# Patient Record
Sex: Female | Born: 1990 | Race: Black or African American | Hispanic: No | Marital: Single | Smoking: Former smoker
Health system: Southern US, Community
[De-identification: ages and names within clinical notes are randomized; demographics above are authoritative.]

## PROBLEM LIST (undated history)

## (undated) HISTORY — PX: INDUCED ABORTION: SHX677

---
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
## 2015-04-17 ENCOUNTER — Encounter (HOSPITAL_COMMUNITY): Payer: Self-pay | Admitting: Emergency Medicine

## 2015-04-17 ENCOUNTER — Emergency Department (HOSPITAL_COMMUNITY)
Admission: EM | Admit: 2015-04-17 | Discharge: 2015-04-17 | Disposition: A | Discharge status: Home / Self Care | Payer: BLUE CROSS/BLUE SHIELD | Attending: Emergency Medicine | Admitting: Emergency Medicine

## 2015-04-17 DIAGNOSIS — L02411 Cutaneous abscess of right axilla: Secondary | ICD-10-CM | POA: Insufficient documentation

## 2015-04-17 DIAGNOSIS — R350 Frequency of micturition: Secondary | ICD-10-CM | POA: Diagnosis present

## 2015-04-17 DIAGNOSIS — L02412 Cutaneous abscess of left axilla: Secondary | ICD-10-CM | POA: Insufficient documentation

## 2015-04-17 DIAGNOSIS — R300 Dysuria: Secondary | ICD-10-CM | POA: Insufficient documentation

## 2015-04-17 DIAGNOSIS — Z3202 Encounter for pregnancy test, result negative: Secondary | ICD-10-CM | POA: Diagnosis not present

## 2015-04-17 DIAGNOSIS — L0291 Cutaneous abscess, unspecified: Secondary | ICD-10-CM

## 2015-04-17 LAB — URINALYSIS, ROUTINE W REFLEX MICROSCOPIC
Bilirubin Urine: NEGATIVE
Glucose, UA: NEGATIVE mg/dL
Hgb urine dipstick: NEGATIVE
Ketones, ur: NEGATIVE mg/dL
Leukocytes, UA: NEGATIVE
Nitrite: NEGATIVE
Protein, ur: NEGATIVE mg/dL
Specific Gravity, Urine: 1.026 (ref 1.005–1.030)
Urobilinogen, UA: 0.2 mg/dL (ref 0.0–1.0)
pH: 6 (ref 5.0–8.0)

## 2015-04-17 LAB — POC URINE PREG, ED: Preg Test, Ur: NEGATIVE

## 2015-04-17 MED ORDER — CLINDAMYCIN HCL 300 MG PO CAPS: 300.0000 mg | ORAL_CAPSULE | Freq: Three times a day (TID) | ORAL | Status: DC

## 2015-04-17 NOTE — ED Provider Notes (Signed)
CSN: [id number]     Arrival date & time 04/17/15  0759 History   First MD Initiated Contact with Patient 04/17/15 [phone]     Chief Complaint  Patient presents with  . Abscess  . Urinary Frequency     (Consider location/radiation/quality/duration/timing/severity/associated sxs/prior Treatment) HPI Comments: Pt comes in with c/o urinary frequency and urgency that started a couple of days ago. No fever, abdominal pain, back pain, or vaginal discharge. Pt states that she also has swelling to the bilateral axilla. Has tried compresses without relief.  The history is provided by the patient. No language interpreter was used.    History reviewed. No pertinent past medical history. History reviewed. No pertinent past surgical history. History reviewed. No pertinent family history. History  Substance Use Topics  . Smoking status: Not on file  . Smokeless tobacco: Not on file  . Alcohol Use: Not on file   OB History    No data available     Review of Systems  All other systems reviewed and are negative.     Allergies  Review of patient's allergies indicates no known allergies.  Home Medications   Prior to Admission medications   Not on File   BP 111/68 mmHg  Pulse 96  Temp(Src) 98.6 F (37 C) (Oral)  Resp 20  SpO2 100%  LMP 03/19/2015 Physical Exam  Constitutional: She is oriented to person, place, and time. She appears well-developed and well-nourished.  HENT:  Head: Normocephalic and atraumatic.  Right Ear: External ear normal.  Left Ear: External ear normal.  Pulmonary/Chest: Effort normal and breath sounds normal.  Abdominal: Soft. Bowel sounds are normal. There is no tenderness.  Musculoskeletal: Normal range of motion.  Neurological: She is alert and oriented to person, place, and time.  Skin:  Small raised red areas to left axilla. Small bumps to right axilla  Nursing note and vitals reviewed.   ED Course  Procedures (including critical care time) Labs  Review Labs Reviewed  URINALYSIS, ROUTINE W REFLEX MICROSCOPIC - Abnormal; Notable for the following:    APPearance HAZY (*)    All other components within normal limits  POC URINE PREG, ED    Imaging Review No results found.   EKG Interpretation None      MDM   Final diagnoses:  Abscess  Dysuria    No infection noted in urine. Will treat abscess with clindamycin. No need for I&D at this time:pt five return precautions   Glendell Docker, NP 04/17/15 Ethel, MD 04/17/15 1625

## 2015-04-17 NOTE — ED Notes (Signed)
NP at bedside.

## 2015-04-17 NOTE — Discharge Instructions (Signed)

## 2015-04-17 NOTE — ED Notes (Signed)
Bilateral nondraining axillary abscesses. Been using warm compresses. Also, c/o urinary pressure. Thinks she has a UTI.

## 2015-04-17 NOTE — ED Notes (Signed)
PT ambulated with baseline gait; VSS; A&Ox3; no signs of distress; respirations even and unlabored; skin warm and dry; no questions upon discharge.  

## 2015-11-18 ENCOUNTER — Encounter (HOSPITAL_COMMUNITY): Payer: Self-pay | Admitting: Emergency Medicine

## 2015-11-18 ENCOUNTER — Emergency Department (HOSPITAL_COMMUNITY)
Admission: EM | Admit: 2015-11-18 | Discharge: 2015-11-18 | Disposition: A | Discharge status: Home / Self Care | Payer: Medicaid Other | Attending: Emergency Medicine | Admitting: Emergency Medicine

## 2015-11-18 DIAGNOSIS — L259 Unspecified contact dermatitis, unspecified cause: Secondary | ICD-10-CM | POA: Insufficient documentation

## 2015-11-18 DIAGNOSIS — Z792 Long term (current) use of antibiotics: Secondary | ICD-10-CM | POA: Insufficient documentation

## 2015-11-18 DIAGNOSIS — R21 Rash and other nonspecific skin eruption: Secondary | ICD-10-CM | POA: Diagnosis present

## 2015-11-18 MED ORDER — PREDNISONE 20 MG PO TABS: ORAL_TABLET | ORAL | Status: DC

## 2015-11-18 MED ORDER — TRIAMCINOLONE ACETONIDE 0.1 % EX LOTN: 1.0000 "application " | TOPICAL_LOTION | Freq: Three times a day (TID) | CUTANEOUS | Status: DC

## 2015-11-18 MED ORDER — HYDROXYZINE HCL 25 MG PO TABS: 25.0000 mg | ORAL_TABLET | Freq: Four times a day (QID) | ORAL | Status: DC

## 2015-11-18 NOTE — ED Notes (Signed)
Patient states rash x few days.   Patient states took 2 ibuprofen x 2 days ago.  Patient took benadryl yesterday.   Patient states rash on face, neck, chest, abdomen, back, arms.  Had some new soap, body scrub about 2 or 3 days ago.

## 2015-11-18 NOTE — ED Notes (Signed)
See PA assessment 

## 2015-11-18 NOTE — ED Provider Notes (Signed)
CSN: [id number]     Arrival date & time 11/18/15  0911 History  By signing my name below, I, Tracy Garza, attest that this documentation has been prepared under the direction and in the presence of Tracy Mail, PA-C. Electronically Signed: Stephania Garza, ED Scribe. 11/18/2015. 9:34 AM.    Chief Complaint  Patient presents with  . Rash   The history is provided by the patient. No language interpreter was used.    HPI Comments: Tracy Garza is a 24 y.o. female who presents to the Emergency Department complaining of a gradual-onset, constant, gradually spreading, generalized rash, particularly on her face and neck, abdomen, and back, that began 2 days ago. She reports she used a new soap - strawberry body scrub - about 2-3 days ago, before her rash onset. She also states she took 2 ibuprofen tablets before her rash onset but denies any previous allergic reactions to medications. She states she first noticed itching on her neck 2 days ago, and the sensation of itching then spread when she noticed the rash develop. Patient denies taking any regular medications. She states she took 1 benadryl yesterday, with mild relief to her symptoms. No one else in the house has a similar rash. Patient has NKDA. She denies wheezing, SOB, throat-closing sensation, or facial swelling.   No past medical history on file. No past surgical history on file. No family history on file. Social History  Substance Use Topics  . Smoking status: Not on file  . Smokeless tobacco: Not on file  . Alcohol Use: Not on file   OB History    No data available     Review of Systems  HENT: Negative for facial swelling and trouble swallowing.   Respiratory: Negative for shortness of breath and wheezing.   Skin: Positive for rash.   Allergies  Review of patient's allergies indicates no known allergies.  Home Medications   Prior to Admission medications   Medication Sig Start Date End Date Taking? Authorizing Provider   clindamycin (CLEOCIN) 300 MG capsule Take 1 capsule (300 mg total) by mouth 3 (three) times daily. 04/17/15   Glendell Docker, NP   There were no vitals taken for this visit. Physical Exam  Constitutional: She is oriented to person, place, and time. She appears well-developed and well-nourished. No distress.  HENT:  Head: Normocephalic and atraumatic.  Airway patent. Uvula midline. No swelling or edema.   Eyes: Conjunctivae and EOM are normal.  Neck: Neck supple. No tracheal deviation present.  No stridor.  Cardiovascular: Normal rate.   Pulmonary/Chest: Effort normal. No stridor. No respiratory distress.  Lungs are clear to auscultation. Breathing is normal.   Abdominal: Soft. There is no tenderness.  Musculoskeletal: Normal range of motion.  Neurological: She is alert and oriented to person, place, and time.  Skin: Skin is warm and dry. Rash noted. There is erythema.  Multiple areas of singular and confluent erythematous papules. Areas of excoriation across the chest, arms, abdomen, back, neck, and below the knees. No involvement of the thighs or genital region. Warm and erythematous in appearance. Is similar to an allergic reaction.   Psychiatric: She has a normal mood and affect. Her behavior is normal.  Nursing note and vitals reviewed.   ED Course  Procedures (including critical care time)  DIAGNOSTIC STUDIES: Oxygen Saturation is 99% on RA, normal by my interpretation.    COORDINATION OF CARE: 9:30 AM - Discussed treatment plan with pt at bedside. Pt verbalized understanding and agreed  to plan.   MDM   Final diagnoses:  None   Patient with contact dermatitis. Instructed to avoid offending agent and to use unscented soaps, lotions, and detergents. Will treat with prednisone, Atarax, and Kenalog lotion. Discussed possibility of allergy to ibuprofen  No signs of secondary infection. Follow up with PCP in 2-3 days. If symptoms worsen, she should return to the ED. Return  precautions discussed. Pt is safe for discharge at this time.   I personally performed the services described in this documentation, which was scribed in my presence. The recorded information has been reviewed and is accurate.          Tracy Mail, PA-C 11/18/15 [id number]  Virgel Manifold, MD 11/24/15 [phone]

## 2015-11-18 NOTE — Discharge Instructions (Signed)
Contact Dermatitis Dermatitis is redness, soreness, and swelling (inflammation) of the skin. Contact dermatitis is a reaction to certain substances that touch the skin. There are two types of contact dermatitis:   Irritant contact dermatitis. This type is caused by something that irritates your skin, such as dry hands from washing them too much. This type does not require previous exposure to the substance for a reaction to occur. This type is more common.  Allergic contact dermatitis. This type is caused by a substance that you are allergic to, such as a nickel allergy or poison ivy. This type only occurs if you have been exposed to the substance (allergen) before. Upon a repeat exposure, your body reacts to the substance. This type is less common. CAUSES  Many different substances can cause contact dermatitis. Irritant contact dermatitis is most commonly caused by exposure to:   Makeup.   Soaps.   Detergents.   Bleaches.   Acids.   Metal salts, such as nickel.  Allergic contact dermatitis is most commonly caused by exposure to:   Poisonous plants.   Chemicals.   Jewelry.   Latex.   Medicines.   Preservatives in products, such as clothing.  RISK FACTORS This condition is more likely to develop in:   People who have jobs that expose them to irritants or allergens.  People who have certain medical conditions, such as asthma or eczema.  SYMPTOMS  Symptoms of this condition may occur anywhere on your body where the irritant has touched you or is touched by you. Symptoms include:  Dryness or flaking.   Redness.   Cracks.   Itching.   Pain or a burning feeling.   Blisters.  Drainage of small amounts of blood or clear fluid from skin cracks. With allergic contact dermatitis, there may also be swelling in areas such as the eyelids, mouth, or genitals.  DIAGNOSIS  This condition is diagnosed with a medical history and physical exam. A patch skin test  may be performed to help determine the cause. If the condition is related to your job, you may need to see an occupational medicine specialist. TREATMENT Treatment for this condition includes figuring out what caused the reaction and protecting your skin from further contact. Treatment may also include:   Steroid creams or ointments. Oral steroid medicines may be needed in more severe cases.  Antibiotics or antibacterial ointments, if a skin infection is present.  Antihistamine lotion or an antihistamine taken by mouth to ease itching.  A bandage (dressing). HOME CARE INSTRUCTIONS Skin Care  Moisturize your skin as needed.   Apply cool compresses to the affected areas.  Try taking a bath with:  Epsom salts. Follow the instructions on the packaging. You can get these at your local pharmacy or grocery store.  Baking soda. Pour a small amount into the bath as directed by your health care provider.  Colloidal oatmeal. Follow the instructions on the packaging. You can get this at your local pharmacy or grocery store.  Try applying baking soda paste to your skin. Stir water into baking soda until it reaches a paste-like consistency.  Do not scratch your skin.  Bathe less frequently, such as every other day.  Bathe in lukewarm water. Avoid using hot water. Medicines  Take or apply over-the-counter and prescription medicines only as told by your health care provider.   If you were prescribed an antibiotic medicine, take or apply your antibiotic as told by your health care provider. Do not stop using the   antibiotic even if your condition starts to improve. General Instructions  Keep all follow-up visits as told by your health care provider. This is important.  Avoid the substance that caused your reaction. If you do not know what caused it, keep a journal to try to track what caused it. Write down:  What you eat.  What cosmetic products you use.  What you drink.  What  you wear in the affected area. This includes jewelry.  If you were given a dressing, take care of it as told by your health care provider. This includes when to change and remove it. SEEK MEDICAL CARE IF:   Your condition does not improve with treatment.  Your condition gets worse.  You have signs of infection such as swelling, tenderness, redness, soreness, or warmth in the affected area.  You have a fever.  You have new symptoms. SEEK IMMEDIATE MEDICAL CARE IF:   You have a severe headache, neck pain, or neck stiffness.  You vomit.  You feel very sleepy.  You notice red streaks coming from the affected area.  Your bone or joint underneath the affected area becomes painful after the skin has healed.  The affected area turns darker.  You have difficulty breathing.   This information is not intended to replace advice given to you by your health care provider. Make sure you discuss any questions you have with your health care provider.   Document Released: 12/02/2000 Document Revised: 08/26/2015 Document Reviewed: 04/22/2015 Elsevier Interactive Patient Education 2016 [employer]  

## 2015-12-12 ENCOUNTER — Emergency Department (HOSPITAL_COMMUNITY): Payer: Medicaid Other

## 2015-12-12 ENCOUNTER — Encounter (HOSPITAL_COMMUNITY): Payer: Self-pay | Admitting: *Deleted

## 2015-12-12 ENCOUNTER — Inpatient Hospital Stay (HOSPITAL_COMMUNITY)
Admission: EM | Admit: 2015-12-12 | Discharge: 2015-12-15 | DRG: 872 | Disposition: A | Discharge status: Home / Self Care | Payer: Medicaid Other | Attending: Internal Medicine | Admitting: Internal Medicine

## 2015-12-12 DIAGNOSIS — R000 Tachycardia, unspecified: Secondary | ICD-10-CM | POA: Diagnosis present

## 2015-12-12 DIAGNOSIS — N390 Urinary tract infection, site not specified: Secondary | ICD-10-CM | POA: Diagnosis not present

## 2015-12-12 DIAGNOSIS — A4151 Sepsis due to Escherichia coli [E. coli]: Secondary | ICD-10-CM | POA: Diagnosis not present

## 2015-12-12 DIAGNOSIS — R1032 Left lower quadrant pain: Secondary | ICD-10-CM | POA: Diagnosis not present

## 2015-12-12 DIAGNOSIS — A419 Sepsis, unspecified organism: Secondary | ICD-10-CM | POA: Diagnosis not present

## 2015-12-12 DIAGNOSIS — R059 Cough, unspecified: Secondary | ICD-10-CM | POA: Diagnosis present

## 2015-12-12 DIAGNOSIS — R05 Cough: Secondary | ICD-10-CM | POA: Diagnosis not present

## 2015-12-12 DIAGNOSIS — D72829 Elevated white blood cell count, unspecified: Secondary | ICD-10-CM | POA: Diagnosis present

## 2015-12-12 DIAGNOSIS — E871 Hypo-osmolality and hyponatremia: Secondary | ICD-10-CM | POA: Diagnosis present

## 2015-12-12 DIAGNOSIS — N12 Tubulo-interstitial nephritis, not specified as acute or chronic: Secondary | ICD-10-CM | POA: Diagnosis present

## 2015-12-12 DIAGNOSIS — B9620 Unspecified Escherichia coli [E. coli] as the cause of diseases classified elsewhere: Secondary | ICD-10-CM | POA: Diagnosis present

## 2015-12-12 DIAGNOSIS — E876 Hypokalemia: Secondary | ICD-10-CM | POA: Diagnosis present

## 2015-12-12 DIAGNOSIS — E861 Hypovolemia: Secondary | ICD-10-CM | POA: Diagnosis present

## 2015-12-12 DIAGNOSIS — F17210 Nicotine dependence, cigarettes, uncomplicated: Secondary | ICD-10-CM | POA: Diagnosis present

## 2015-12-12 DIAGNOSIS — R109 Unspecified abdominal pain: Secondary | ICD-10-CM | POA: Diagnosis present

## 2015-12-12 DIAGNOSIS — R6889 Other general symptoms and signs: Secondary | ICD-10-CM | POA: Diagnosis not present

## 2015-12-12 LAB — CBC WITH DIFFERENTIAL/PLATELET
Basophils Absolute: 0 10*3/uL (ref 0.0–0.1)
Basophils Absolute: 0 10*3/uL (ref 0.0–0.1)
Basophils Relative: 0 %
Basophils Relative: 0 %
EOS ABS: 0 10*3/uL (ref 0.0–0.7)
Eosinophils Absolute: 0 10*3/uL (ref 0.0–0.7)
Eosinophils Relative: 0 %
Eosinophils Relative: 0 %
HCT: 34.6 % — ABNORMAL LOW (ref 36.0–46.0)
HEMATOCRIT: 37.5 % (ref 36.0–46.0)
HEMOGLOBIN: 12.8 g/dL (ref 12.0–15.0)
HEMOGLOBIN: 11.5 g/dL — AB (ref 12.0–15.0)
LYMPHS ABS: 0.8 10*3/uL (ref 0.7–4.0)
LYMPHS ABS: 0.7 10*3/uL (ref 0.7–4.0)
LYMPHS PCT: 6 %
LYMPHS PCT: 5 %
MCH: 30.3 pg (ref 26.0–34.0)
MCH: 29.9 pg (ref 26.0–34.0)
MCHC: 34.1 g/dL (ref 30.0–36.0)
MCHC: 33.2 g/dL (ref 30.0–36.0)
MCV: 88.7 fL (ref 78.0–100.0)
MCV: 90.1 fL (ref 78.0–100.0)
MONO ABS: 0.5 10*3/uL (ref 0.1–1.0)
MONOS PCT: 4 %
Monocytes Absolute: 0.8 10*3/uL (ref 0.1–1.0)
Monocytes Relative: 5 %
NEUTROS ABS: 11.3 10*3/uL — AB (ref 1.7–7.7)
NEUTROS PCT: 89 %
Neutro Abs: 13 10*3/uL — ABNORMAL HIGH (ref 1.7–7.7)
Neutrophils Relative %: 91 %
Platelets: 125 10*3/uL — ABNORMAL LOW (ref 150–400)
Platelets: 107 10*3/uL — ABNORMAL LOW (ref 150–400)
RBC: 4.23 MIL/uL (ref 3.87–5.11)
RBC: 3.84 MIL/uL — ABNORMAL LOW (ref 3.87–5.11)
RDW: 12.5 % (ref 11.5–15.5)
RDW: 12.5 % (ref 11.5–15.5)
WBC: 14.6 10*3/uL — AB (ref 4.0–10.5)
WBC: 12.4 10*3/uL — AB (ref 4.0–10.5)

## 2015-12-12 LAB — BASIC METABOLIC PANEL
Anion gap: 9 (ref 5–15)
BUN: 8 mg/dL (ref 6–20)
CHLORIDE: 102 mmol/L (ref 101–111)
CO2: 22 mmol/L (ref 22–32)
Calcium: 8.8 mg/dL — ABNORMAL LOW (ref 8.9–10.3)
Creatinine, Ser: 0.76 mg/dL (ref 0.44–1.00)
GFR calc Af Amer: >60 mL/min (ref >60)
GFR calc non Af Amer: >60 mL/min (ref >60)
GLUCOSE: 94 mg/dL (ref 65–99)
POTASSIUM: 3.4 mmol/L — AB (ref 3.5–5.1)
Sodium: 133 mmol/L — ABNORMAL LOW (ref 135–145)

## 2015-12-12 LAB — WET PREP, GENITAL
Clue Cells Wet Prep HPF POC: NONE SEEN
Sperm: NONE SEEN
Trich, Wet Prep: NONE SEEN
Yeast Wet Prep HPF POC: NONE SEEN

## 2015-12-12 LAB — PREGNANCY, URINE: Preg Test, Ur: NEGATIVE

## 2015-12-12 LAB — URINALYSIS, ROUTINE W REFLEX MICROSCOPIC
GLUCOSE, UA: NEGATIVE mg/dL
Ketones, ur: 40 mg/dL — AB
Nitrite: POSITIVE — AB
Protein, ur: 30 mg/dL — AB
SPECIFIC GRAVITY, URINE: 1.017 (ref 1.005–1.030)
pH: 6 (ref 5.0–8.0)

## 2015-12-12 LAB — URINE MICROSCOPIC-ADD ON

## 2015-12-12 LAB — FIBRINOGEN: FIBRINOGEN: 487 mg/dL — AB (ref 204–475)

## 2015-12-12 LAB — MRSA PCR SCREENING: MRSA BY PCR: NEGATIVE

## 2015-12-12 LAB — COMPREHENSIVE METABOLIC PANEL
ALBUMIN: 2.7 g/dL — AB (ref 3.5–5.0)
ALT: 13 U/L — AB (ref 14–54)
AST: 19 U/L (ref 15–41)
Alkaline Phosphatase: 53 U/L (ref 38–126)
Anion gap: 10 (ref 5–15)
BILIRUBIN TOTAL: 1.1 mg/dL (ref 0.3–1.2)
BUN: 6 mg/dL (ref 6–20)
CHLORIDE: 105 mmol/L (ref 101–111)
CO2: 18 mmol/L — ABNORMAL LOW (ref 22–32)
CREATININE: 0.69 mg/dL (ref 0.44–1.00)
Calcium: 7.6 mg/dL — ABNORMAL LOW (ref 8.9–10.3)
GFR calc Af Amer: >60 mL/min (ref >60)
GLUCOSE: 107 mg/dL — AB (ref 65–99)
POTASSIUM: 3.3 mmol/L — AB (ref 3.5–5.1)
Sodium: 133 mmol/L — ABNORMAL LOW (ref 135–145)
Total Protein: 5.6 g/dL — ABNORMAL LOW (ref 6.5–8.1)

## 2015-12-12 LAB — INFLUENZA PANEL BY PCR (TYPE A & B)
H1N1 flu by pcr: NOT DETECTED
Influenza A By PCR: NEGATIVE
Influenza B By PCR: NEGATIVE

## 2015-12-12 LAB — I-STAT CG4 LACTIC ACID, ED
Lactic Acid, Venous: 1.81 mmol/L (ref 0.5–2.0)
Lactic Acid, Venous: 0.6 mmol/L (ref 0.5–2.0)

## 2015-12-12 LAB — APTT: APTT: 35 s (ref 24–37)

## 2015-12-12 LAB — CK: CK TOTAL: 44 U/L (ref 38–234)

## 2015-12-12 LAB — PROTIME-INR
INR: 1.28 (ref 0.00–1.49)
Prothrombin Time: 16.1 seconds — ABNORMAL HIGH (ref 11.6–15.2)

## 2015-12-12 LAB — PROCALCITONIN: PROCALCITONIN: 1.04 ng/mL

## 2015-12-12 MED ORDER — DEXTROSE 5 % IV SOLN
1.0000 g | INTRAVENOUS | Status: DC
  Administered 2015-12-13 – 2015-12-14 (×2): 1 g via INTRAVENOUS
  Filled 2015-12-12 (×3): qty 10

## 2015-12-12 MED ORDER — KETOROLAC TROMETHAMINE 30 MG/ML IJ SOLN
30.0000 mg | Freq: Three times a day (TID) | INTRAMUSCULAR | Status: DC | PRN
  Administered 2015-12-13: 30 mg via INTRAVENOUS
  Filled 2015-12-12 (×2): qty 1

## 2015-12-12 MED ORDER — SODIUM CHLORIDE 0.9 % IV BOLUS (SEPSIS)
1000.0000 mL | Freq: Once | INTRAVENOUS | Status: AC
  Administered 2015-12-12: 1000 mL via INTRAVENOUS

## 2015-12-12 MED ORDER — CYCLOBENZAPRINE HCL 10 MG PO TABS
5.0000 mg | ORAL_TABLET | Freq: Three times a day (TID) | ORAL | Status: DC | PRN
Start: 2015-12-12 — End: 2015-12-15
  Administered 2015-12-13: 5 mg via ORAL
  Filled 2015-12-12: qty 1

## 2015-12-12 MED ORDER — ACETAMINOPHEN 325 MG PO TABS
650.0000 mg | ORAL_TABLET | Freq: Four times a day (QID) | ORAL | Status: DC | PRN
  Administered 2015-12-12: 650 mg via ORAL
  Filled 2015-12-12: qty 2

## 2015-12-12 MED ORDER — ENOXAPARIN SODIUM 30 MG/0.3ML ~~LOC~~ SOLN
20.0000 mg | SUBCUTANEOUS | Status: DC
  Administered 2015-12-12 – 2015-12-14 (×3): 20 mg via SUBCUTANEOUS
  Filled 2015-12-12 (×2): qty 0.2
  Filled 2015-12-12 (×3): qty 0.3

## 2015-12-12 MED ORDER — DOXYCYCLINE HYCLATE 100 MG IV SOLR
100.0000 mg | Freq: Two times a day (BID) | INTRAVENOUS | Status: DC
  Administered 2015-12-12 – 2015-12-15 (×6): 100 mg via INTRAVENOUS
  Filled 2015-12-12 (×9): qty 100

## 2015-12-12 MED ORDER — ONDANSETRON HCL 4 MG/2ML IJ SOLN: 4.0000 mg | Freq: Four times a day (QID) | INTRAMUSCULAR | Status: DC | PRN

## 2015-12-12 MED ORDER — MORPHINE SULFATE (PF) 2 MG/ML IV SOLN
2.0000 mg | Freq: Once | INTRAVENOUS | Status: AC
  Administered 2015-12-12: 2 mg via INTRAVENOUS
  Filled 2015-12-12: qty 1

## 2015-12-12 MED ORDER — DEXTROSE 5 % IV SOLN
1.0000 g | Freq: Once | INTRAVENOUS | Status: AC
  Administered 2015-12-12: 1 g via INTRAVENOUS
  Filled 2015-12-12: qty 10

## 2015-12-12 MED ORDER — OXYCODONE-ACETAMINOPHEN 5-325 MG PO TABS
1.0000 | ORAL_TABLET | Freq: Once | ORAL | Status: AC
  Administered 2015-12-12: 1 via ORAL
  Filled 2015-12-12: qty 1

## 2015-12-12 MED ORDER — ACETAMINOPHEN 325 MG PO TABS
650.0000 mg | ORAL_TABLET | Freq: Four times a day (QID) | ORAL | Status: DC | PRN
  Administered 2015-12-13 (×2): 650 mg via ORAL
  Filled 2015-12-12 (×2): qty 2

## 2015-12-12 MED ORDER — IBUPROFEN 800 MG PO TABS
800.0000 mg | ORAL_TABLET | Freq: Once | ORAL | Status: AC
  Administered 2015-12-12: 800 mg via ORAL
  Filled 2015-12-12: qty 1

## 2015-12-12 MED ORDER — ONDANSETRON HCL 4 MG PO TABS
4.0000 mg | ORAL_TABLET | Freq: Four times a day (QID) | ORAL | Status: DC | PRN
  Administered 2015-12-15: 4 mg via ORAL
  Filled 2015-12-12: qty 1

## 2015-12-12 MED ORDER — DIPHENHYDRAMINE HCL 50 MG/ML IJ SOLN
25.0000 mg | Freq: Once | INTRAMUSCULAR | Status: AC
  Administered 2015-12-12: 25 mg via INTRAVENOUS
  Filled 2015-12-12: qty 1

## 2015-12-12 MED ORDER — ALPRAZOLAM 0.25 MG PO TABS
0.2500 mg | ORAL_TABLET | Freq: Three times a day (TID) | ORAL | Status: DC | PRN
  Administered 2015-12-13 – 2015-12-14 (×2): 0.25 mg via ORAL
  Filled 2015-12-12 (×3): qty 1

## 2015-12-12 MED ORDER — SODIUM CHLORIDE 0.9 % IJ SOLN
3.0000 mL | Freq: Two times a day (BID) | INTRAMUSCULAR | Status: DC
  Administered 2015-12-12 – 2015-12-13 (×3): 3 mL via INTRAVENOUS

## 2015-12-12 MED ORDER — ACETAMINOPHEN 650 MG RE SUPP: 650.0000 mg | Freq: Four times a day (QID) | RECTAL | Status: DC | PRN

## 2015-12-12 MED ORDER — SODIUM CHLORIDE 0.9 % IV SOLN
INTRAVENOUS | Status: DC
  Administered 2015-12-12 – 2015-12-14 (×2): via INTRAVENOUS

## 2015-12-12 NOTE — ED Notes (Signed)
Pt began having abdominal and back pain 3 days ago with urinary frequency with a bad odor. Pt denies any vaginal d/c or bleeding.

## 2015-12-12 NOTE — ED Provider Notes (Signed)
Complains of left flank pain radiating to lower abdomen for the past 3 days at [place] by nausea no vomiting and subjective fever. On exam patient is alert appears uncomfortable abdomen soft nontender she is markedly tender at left flank  Orlie Dakin, MD 12/12/15 1721

## 2015-12-12 NOTE — ED Notes (Signed)
Merry Proud PA notified of pt temp and HR, no new orders.

## 2015-12-12 NOTE — ED Provider Notes (Signed)
Hand-off from [employer], PA-C.  Pt is a 24 year old female presents today with abdominal pain, flank pain fever, urinary complaints.  Initial exam revealed tenderness in LLQ, no peritoneal signs, no CVA tenderness, mild tenderness in left lower back. Pelvic exam revealed small amount of vaginal d/c and no CMT.  WBC 14.6. UA revealed moderate hgb, positive nitrites and leuks, few bacteria. Negative pregnancy.  Initial evaluation she had a temperature of 100.5, respirations of 18 and a pulse of 110. Throughout her stay in the ED pt became more tachycardic and tachypneic (respirations at 28) while maintaining normal blood pressure. Pt given 2 L of normal saline in the ED, lactic acid resulted as normal. Patient is tolerating PO without difficulty but with increasing tachycardia and fever will plan to admit for IV antibiotics for further evaluation and management. Hospitalist (Dr. Posey Pronto) consulted regarding admission who recommended CT renal study to rule out kidney stones. After the results of the study he would be able to determine if this was a patient who will be admitted to [facility] or transferred [facility]. CT pending.  CT revealed no nephrolithiasis. Plan to admit patient to hospitalist service at [facility].       Chesley Noon [place], [place] 12/12/15 1858  Milton Ferguson, MD 12/14/15 [phone]

## 2015-12-12 NOTE — ED Provider Notes (Signed)
CSN: [id number]     Arrival date & time 12/12/15  1019 History   First MD Initiated Contact with Patient 12/12/15 1105     Chief Complaint  Patient presents with  . Back Pain  . Abdominal Pain     HPI   24 year old female presents today with abdominal pain, flank pain fever, urinary complaints. Patient reports symptoms started approximately 2 days ago with urinary frequency, discoloration, and pressure. She reports symptoms persisted until last night when she developed a fever and started feeling left lower back pain. She reports that she was using Motrin, developed nausea overnight no vomiting. History of the same approximately  5 years ago. Patient reports that she is sexually active, does not use protection, no history of STDs. She denies any vaginal discharge, bleeding. Reports her last normal menstrual cycle was 4 weeks ago.   History reviewed. No pertinent past medical history. History reviewed. No pertinent past surgical history. History reviewed. No pertinent family history. Social History  Substance Use Topics  . Smoking status: Current Every Day Smoker -- 0.50 packs/day    Types: Cigarettes  . Smokeless tobacco: None  . Alcohol Use: Yes     Comment: occasionally   OB History    No data available     Review of Systems  All other systems reviewed and are negative.   Allergies  Strawberry (diagnostic)  Home Medications   Prior to Admission medications   Medication Sig Start Date End Date Taking? Authorizing Provider  ibuprofen (ADVIL,MOTRIN) 200 MG tablet Take 400 mg by mouth every 6 (six) hours as needed (pain).   Yes Historical Provider, MD   BP 125/82 mmHg  Pulse 140  Temp(Src) 102.5 F (39.2 C) (Oral)  Resp 28  Ht 4' 9.5" (1.461 m)  Wt 43.999 kg  BMI 20.61 kg/m2  SpO2 98%  LMP 11/27/2015   Physical Exam  Constitutional: She is oriented to person, place, and time. She appears well-developed and well-nourished.  HENT:  Head: Normocephalic and  atraumatic.  Eyes: Conjunctivae are normal. Pupils are equal, round, and reactive to light. Right eye exhibits no discharge. Left eye exhibits no discharge. No scleral icterus.  Neck: Normal range of motion. No JVD present. No tracheal deviation present.  Pulmonary/Chest: Effort normal. No stridor.  Abdominal: Soft. Bowel sounds are normal. She exhibits no distension and no mass. There is tenderness. There is no rebound, no guarding and no CVA tenderness.    Genitourinary: Cervix exhibits no motion tenderness and no discharge. Right adnexum displays no mass, no tenderness and no fullness. Left adnexum displays no mass, no tenderness and no fullness. Vaginal discharge found.  Musculoskeletal:  Minor pain with palpation of left lower back and flank  Neurological: She is alert and oriented to person, place, and time. Coordination normal.  Psychiatric: She has a normal mood and affect. Her behavior is normal. Judgment and thought content normal.  Nursing note and vitals reviewed.   ED Course  Procedures (including critical care time) Labs Review Labs Reviewed  WET PREP, GENITAL - Abnormal; Notable for the following:    WBC, Wet Prep HPF POC MANY (*)    All other components within normal limits  CBC WITH DIFFERENTIAL/PLATELET - Abnormal; Notable for the following:    WBC 14.6 (*)    Platelets 125 (*)    Neutro Abs 13.0 (*)    All other components within normal limits  URINALYSIS, ROUTINE W REFLEX MICROSCOPIC (NOT AT [facility]) - Abnormal; Notable for the following:  Color, Urine AMBER (*)    APPearance CLOUDY (*)    Hgb urine dipstick MODERATE (*)    Bilirubin Urine SMALL (*)    Ketones, ur 40 (*)    Protein, ur 30 (*)    Nitrite POSITIVE (*)    Leukocytes, UA MODERATE (*)    All other components within normal limits  BASIC METABOLIC PANEL - Abnormal; Notable for the following:    Sodium 133 (*)    Potassium 3.4 (*)    Calcium 8.8 (*)    All other components within normal limits    URINE MICROSCOPIC-ADD ON - Abnormal; Notable for the following:    Squamous Epithelial / LPF 0-5 (*)    Bacteria, UA FEW (*)    All other components within normal limits  URINE CULTURE  PREGNANCY, URINE  RPR  HIV ANTIBODY (ROUTINE TESTING)  I-STAT CG4 LACTIC ACID, ED  GC/CHLAMYDIA PROBE AMP ([place]) NOT AT [facility]    Imaging Review No results found. I have personally reviewed and evaluated these images and lab results as part of my medical decision-making.   EKG Interpretation None      MDM   Final diagnoses:  Pyelonephritis  Abdominal pain    Labs: I-STAT lactic acid, RPR, HIV, wet prep, CBC, CMP, urinalysis, urine culture, pregnancy- moderate leukocytes nitrite positive almond of the CBC 14.6  Imaging: CT renal  Consults:  Therapeutics: Motrin, Benadryl, morphine, normal saline, ceftriaxone, Percocet  Discharge Meds:   Assessment/Plan: 24 year old female presents today with pyelonephritis. Upon initial evaluation she had a temperature of 100.5, respirations of 18 and a pulse of 110. Throughout her stay she became more tachycardic and at last evaluation and respirations at 28 maintaining normal blood pressure. She is febrile, tachycardic, with an elevated white count. She was given 2 L of normal saline here in the ED, lactic acid resulted as normal. Patient appeared in no acute distress, but continued to have elevated temperature with worsening tachycardia. Pressure remained stable improved over initial presentation. Patient is tolerating by mouth without difficulty but with increasing tachycardia and fever hospital admission for IV antibiotics for further evaluation and management. Spoke with hospitalist who recommended CT renal study to rule out kidney stones. Pelvic exam showed no cervical motion tenderness, although she did have a small amount of vaginal discharge. Low suspicion for PID. After the results of the study he would be able to determine if this was a patient  who deals [facility] or transferred [facility]. At the time of shift change patient's care was transferred on to oncoming provider pending CT results and hospital admission.  Patient care shared with Hoover Brunette MD who personally evaluated the patient.       Okey Regal, PA-C 12/13/15 [id number]  Orlie Dakin, MD 12/13/15 [phone]

## 2015-12-12 NOTE — Progress Notes (Addendum)
ANTIBIOTIC CONSULT NOTE - INITIAL  Pharmacy Consult for ceftriaxone Indication: UTI  Allergies  Allergen Reactions  . Morphine And Related Hives  . Strawberry (Diagnostic) Hives and Itching    Patient Measurements: Height: 4' 9.5" (146.1 cm) Weight: 97 lb (43.999 kg) IBW/kg (Calculated) : 39.75 Adjusted Body Weight:   Vital Signs: Temp: 102.5 F (39.2 C) (12/24 1441) Temp Source: Oral (12/24 1441) BP: 104/59 mmHg (12/24 1700) Pulse Rate: 134 (12/24 1700) Intake/Output from previous day:   Intake/Output from this shift:    Labs:  Recent Labs  12/12/15 1052  WBC 14.6*  HGB 12.8  PLT 125*  CREATININE 0.76   Estimated Creatinine Clearance: 68.1 mL/min (by C-G formula based on Cr of 0.76). No results for input(s): VANCOTROUGH, VANCOPEAK, VANCORANDOM, GENTTROUGH, GENTPEAK, GENTRANDOM, TOBRATROUGH, TOBRAPEAK, TOBRARND, AMIKACINPEAK, AMIKACINTROU, AMIKACIN in the last 72 hours.   Microbiology: Recent Results (from the past 720 hour(s))  Wet prep, genital     Status: Abnormal   Collection Time: 12/12/15 12:56 PM  Result Value Ref Range Status   Yeast Wet Prep HPF POC NONE SEEN NONE SEEN Final   Trich, Wet Prep NONE SEEN NONE SEEN Final   Clue Cells Wet Prep HPF POC NONE SEEN NONE SEEN Final   WBC, Wet Prep HPF POC MANY (A) NONE SEEN Final   Sperm NONE SEEN  Final    Medical History: History reviewed. No pertinent past medical history.  Assessment: 24 yo with abdominal pain and back pain for last 3 days. WBC 14.6, Pulse 140, Tmax 102.5.  Goal of Therapy:  Resolution of infection  Plan:  Ceftriaxone 1 gram Q24H Monitor CBC Follow up culture results  Melburn Popper 12/12/2015,5:08 PM

## 2015-12-12 NOTE — H&P (Addendum)
[facility] History and Physical  Patient: Tracy Garza  MRN: [id number]  DOB: 14-Nov-1991  DOS: the patient was seen and examined on 12/12/2015 PCP: No PCP Per Patient  Referring physician: Dr. Lenna Sciara Chief Complaint: Generalized body ache  HPI: Tracy Garza is a 24 y.o. female with Past medical history of UTI requiring admission at [facility] 4 years ago. Patient presents with complaints of generalized body ache ongoing for 3 days. She was significantly weak and was unable to walk and stand on her own due to severe pain as well. She also has some cough and fever and chills. She started having complaints of left-sided abdominal pain primarily in the flank. She denies having any blood in the urine or burning urination or increased urination. She started noticing some vaginal discharge today. She denies any high risk behavior. She had her period 3 weeks ago. She denies any recent urological gynecological procedure. She denies any drug or alcohol abuse. She does not take the medication at her baseline. She has been taking Tylenol as well as ibuprofen without any benefit.  The patient is coming from home.  At her baseline ambulates without support And is independent for most of her ADL; manages her medication on her own.  Review of Systems: as mentioned in the history of present illness.  A comprehensive review of the other systems is negative.  History reviewed. No pertinent past medical history. History reviewed. No pertinent past surgical history. Social History:  reports that she has been smoking Cigarettes.  She has been smoking about 0.50 packs per day. She does not have any smokeless tobacco history on file. She reports that she drinks alcohol. She reports that she does not use illicit drugs.  Allergies  Allergen Reactions  . Morphine And Related Hives  . Strawberry (Diagnostic) Hives and Itching    History reviewed. No pertinent family history.  Prior to  Admission medications   Medication Sig Start Date End Date Taking? Authorizing Provider  ibuprofen (ADVIL,MOTRIN) 200 MG tablet Take 400 mg by mouth every 6 (six) hours as needed (pain).   Yes Historical Provider, MD    Physical Exam: Filed Vitals:   12/12/15 1645 12/12/15 1700 12/12/15 1722 12/12/15 1730  BP: 106/61 104/59 98/59 105/71  Pulse: 142 134 134 123  Temp:   102.6 F (39.2 C)   TempSrc:      Resp: 25 35 31 21  Height:      Weight:      SpO2: 98% 97% 97% 98%    General: Alert, Awake and Oriented to Time, Place and Person. Appear in marked distress Eyes: PERRL ENT: Oral Mucosa clear moist. Neck: no JVD Cardiovascular: S1 and S2 Present, no Murmur, Peripheral Pulses Present Respiratory: Bilateral Air entry equal and Decreased,  Clear to Auscultation, no Crackles, no wheezes Abdomen: Bowel Sound present, Soft and left-sided tenderness Skin: no Rash Extremities: no Pedal edema, no calf tenderness Neurologic: Grossly no focal neuro deficit, other than lethargy  Labs on Admission:  CBC:  Recent Labs Lab 12/12/15 1052 12/12/15 1723  WBC 14.6* 12.4*  NEUTROABS 13.0* 11.3*  HGB 12.8 11.5*  HCT 37.5 34.6*  MCV 88.7 90.1  PLT 125* 107*    CMP     Component Value Date/Time   NA 133* 12/12/2015 1723   K 3.3* 12/12/2015 1723   CL 105 12/12/2015 1723   CO2 18* 12/12/2015 1723   GLUCOSE 107* 12/12/2015 1723   BUN 6 12/12/2015 1723   CREATININE  0.69 12/12/2015 1723   CALCIUM 7.6* 12/12/2015 1723   PROT 5.6* 12/12/2015 1723   ALBUMIN 2.7* 12/12/2015 1723   AST 19 12/12/2015 1723   ALT 13* 12/12/2015 1723   ALKPHOS 53 12/12/2015 1723   BILITOT 1.1 12/12/2015 1723   GFRNONAA >60 12/12/2015 1723   GFRAA >60 12/12/2015 1723     Recent Labs Lab 12/12/15 1723  CKTOTAL 44   BNP (last 3 results) No results for input(s): BNP in the last 8760 hours.  ProBNP (last 3 results) No results for input(s): PROBNP in the last 8760 hours.   Radiological Exams on  Admission: Dg Chest Port 1 View  12/12/2015  CLINICAL DATA:  Sepsis pt C/o dry cough, SOB, sternal CP, nausea x3-4 days. Current smoker. EXAM: PORTABLE CHEST - 1 VIEW COMPARISON:  04/07/2011 FINDINGS: Low lung volumes with crowding of bronchovascular structures at the lung bases. No confluent airspace disease or overt edema. Heart size upper limits normal for technique. No effusion.  No pneumothorax. Visualized skeletal structures are unremarkable. IMPRESSION: Low volumes.  No acute disease. Electronically Signed   By: Lucrezia Europe M.D.   On: 12/12/2015 17:33   EKG: Independently reviewed. sinus tachycardia.  Assessment/Plan 1. Sepsis secondary to UTI [facility])  The pt meets SIRS criteria Temperature >38.5C Tachycardia, Tachypnea Leucocytosis. With evidence of infection in urine, suspicious for pyelonephritis. Patient will be admitted in the hospital. We'll continue with ceftriaxone. Give her one liter of bolus as well. Gonococcal and chlamydia pending HIV RPR pending blood culture collected, sputum culture ordered, UDS pending, pro-calcitonin pending, urine pregnancy negative. Add doxycycline at present for PID. Check influenza panel PCR on droplet precaution Admitting stepdown unit. Check CT abdomen and pelvis to rule out any renal stone.  2. Hypokalemia. Replacing orally.  3. Flulike symptom. Checking influenza PCR.   Nutrition: Nothing by mouth pending CT scan results DVT Prophylaxis: subcutaneous Heparin  Advance goals of care discussion: Full code   Consults: None  Family Communication: No family was present at bedside Disposition: Admitted as inpatient, step-down unit.  Author: Berle Mull, MD [facility] Pager: [phone] 12/12/2015  If 7PM-7AM, please contact night-coverage [email or web address] Password [email or web address]

## 2015-12-12 NOTE — ED Notes (Signed)
Merry Proud PA aware of pt HR, no new orders.

## 2015-12-12 NOTE — ED Notes (Signed)
Pt R arm broke out in hives after morphine. Given benadryl for rash. No difficulty breathing or itching

## 2015-12-13 DIAGNOSIS — A419 Sepsis, unspecified organism: Secondary | ICD-10-CM

## 2015-12-13 DIAGNOSIS — N390 Urinary tract infection, site not specified: Secondary | ICD-10-CM

## 2015-12-13 LAB — CBC
HEMATOCRIT: 35.8 % — AB (ref 36.0–46.0)
HEMOGLOBIN: 12 g/dL (ref 12.0–15.0)
MCH: 30.5 pg (ref 26.0–34.0)
MCHC: 33.5 g/dL (ref 30.0–36.0)
MCV: 90.9 fL (ref 78.0–100.0)
Platelets: 114 10*3/uL — ABNORMAL LOW (ref 150–400)
RBC: 3.94 MIL/uL (ref 3.87–5.11)
RDW: 12.7 % (ref 11.5–15.5)
WBC: 14.6 10*3/uL — ABNORMAL HIGH (ref 4.0–10.5)

## 2015-12-13 LAB — COMPREHENSIVE METABOLIC PANEL
ALBUMIN: 2.4 g/dL — AB (ref 3.5–5.0)
ALT: 12 U/L — ABNORMAL LOW (ref 14–54)
ANION GAP: 8 (ref 5–15)
AST: 15 U/L (ref 15–41)
Alkaline Phosphatase: 53 U/L (ref 38–126)
BUN: 6 mg/dL (ref 6–20)
CO2: 24 mmol/L (ref 22–32)
Calcium: 7.9 mg/dL — ABNORMAL LOW (ref 8.9–10.3)
Chloride: 108 mmol/L (ref 101–111)
Creatinine, Ser: 0.6 mg/dL (ref 0.44–1.00)
GFR calc Af Amer: >60 mL/min (ref >60)
GFR calc non Af Amer: >60 mL/min (ref >60)
GLUCOSE: 98 mg/dL (ref 65–99)
POTASSIUM: 3.8 mmol/L (ref 3.5–5.1)
SODIUM: 140 mmol/L (ref 135–145)
Total Bilirubin: 0.4 mg/dL (ref 0.3–1.2)
Total Protein: 5.6 g/dL — ABNORMAL LOW (ref 6.5–8.1)

## 2015-12-13 LAB — LACTIC ACID, PLASMA: Lactic Acid, Venous: 1.3 mmol/L (ref 0.5–2.0)

## 2015-12-13 LAB — HIV ANTIBODY (ROUTINE TESTING W REFLEX): HIV Screen 4th Generation wRfx: NONREACTIVE

## 2015-12-13 LAB — RPR: RPR Ser Ql: NONREACTIVE

## 2015-12-13 MED ORDER — BISACODYL 10 MG RE SUPP: 10.0000 mg | Freq: Every day | RECTAL | Status: DC | PRN

## 2015-12-13 MED ORDER — FENTANYL CITRATE (PF) 100 MCG/2ML IJ SOLN
25.0000 ug | INTRAMUSCULAR | Status: DC | PRN
  Administered 2015-12-13 (×2): 25 ug via INTRAVENOUS
  Filled 2015-12-13 (×2): qty 2

## 2015-12-13 MED ORDER — SENNOSIDES-DOCUSATE SODIUM 8.6-50 MG PO TABS
1.0000 | ORAL_TABLET | Freq: Two times a day (BID) | ORAL | Status: DC
  Administered 2015-12-13 (×2): 1 via ORAL
  Filled 2015-12-13 (×3): qty 1

## 2015-12-13 MED ORDER — SODIUM CHLORIDE 0.9 % IV BOLUS (SEPSIS)
500.0000 mL | Freq: Once | INTRAVENOUS | Status: AC
  Administered 2015-12-13: 500 mL via INTRAVENOUS

## 2015-12-13 NOTE — Progress Notes (Signed)
[place] TEAM 1 - Stepdown/ICU TEAM PROGRESS NOTE  GLORIOUS SAFFER [id number] DOB: August 21, 1991 DOA: 12/12/2015 PCP: No PCP Per Patient  Admit HPI / Brief Narrative: 24 y.o. female with history of UTI requiring admission at [facility] 4 years ago who presented with complaints of generalized body aches for 3 days.  She became weak and was unable to walk and stand on her own.  This was accompanied by left-sided pain primarily in the flank.  HPI/Subjective: The pt continues to have fever to 103.  She denies cp, sob, n/v.  She continues to report L flank adn CVA area pain, but denies any pain in the R upper or lower abdom quadrant whatsoever.  She denies abdom distention/bloating.    Assessment/Plan:  Sepsis secondary to UTI   Temperature >38.5C, Tachycardia, Tachypnea, Leucocytosis with evidence of pyelonephritis - GC/chlamydia, HIV pending - RPR negative - pyelonephritis v/s PID - cont empiric abx - follow cx data   ?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 - no evidence to suggest bowel obstruction or colitis at present - follow clinically - advance to clear liquid diet and follow   Hypokalemia Replacing orally  Hyponatremia Due to hypovolemia - cont to volume resuscitate  Code Status: FULL Family Communication: no family present at time of exam Disposition Plan: SDU   Consultants: none  Procedures: none  Antibiotics: Rocephin 12/24 > Doxycycline 12/24 >  DVT prophylaxis: Lovenox  Objective: Blood pressure 115/68, pulse 122, temperature 103 F (39.4 C), temperature source Oral, resp. rate 27, height 4' 9.5" (1.461 m), weight 43.999 kg (97 lb), last menstrual period 11/27/2015, SpO2 98 %.  Intake/Output Summary (Last 24 hours) at 12/13/15 1239 Last data filed at 12/13/15 1202  Gross per 24 hour  Intake      0 ml  Output    225 ml  Net   -225 ml     Exam: General: No acute respiratory distress - alert and conversant  Lungs: Clear to auscultation bilaterally  without wheezes or crackles Cardiovascular: Regular rate and rhythm without murmur gallop or rub normal S1 and S2 Abdomen: modestly tender to deep palpation in L flank, nondistended, soft, bowel sounds positive, no rebound, no ascites, no appreciable mass Extremities: No significant cyanosis, clubbing, or edema bilateral lower extremities  Data Reviewed: Basic Metabolic Panel:  Recent Labs Lab 12/12/15 1052 12/12/15 1723 12/13/15 0258  NA 133* 133* 140  K 3.4* 3.3* 3.8  CL 102 105 108  CO2 22 18* 24  GLUCOSE 94 107* 98  BUN 8 6 6   CREATININE 0.76 0.69 0.60  CALCIUM 8.8* 7.6* 7.9*    CBC:  Recent Labs Lab 12/12/15 1052 12/12/15 1723 12/13/15 0258  WBC 14.6* 12.4* 14.6*  NEUTROABS 13.0* 11.3*  --   HGB 12.8 11.5* 12.0  HCT 37.5 34.6* 35.8*  MCV 88.7 90.1 90.9  PLT 125* 107* 114*    Liver Function Tests:  Recent Labs Lab 12/12/15 1723 12/13/15 0258  AST 19 15  ALT 13* 12*  ALKPHOS 53 53  BILITOT 1.1 0.4  PROT 5.6* 5.6*  ALBUMIN 2.7* 2.4*   Coags:  Recent Labs Lab 12/12/15 1723  INR 1.28    Recent Labs Lab 12/12/15 1723  APTT 35    Cardiac Enzymes:  Recent Labs Lab 12/12/15 1723  CKTOTAL 44    Recent Results (from the past 240 hour(s))  Wet prep, genital     Status: Abnormal   Collection Time: 12/12/15 12:56 PM  Result Value Ref Range  Status   Yeast Wet Prep HPF POC NONE SEEN NONE SEEN Final   Trich, Wet Prep NONE SEEN NONE SEEN Final   Clue Cells Wet Prep HPF POC NONE SEEN NONE SEEN Final   WBC, Wet Prep HPF POC MANY (A) NONE SEEN Final   Sperm NONE SEEN  Final  MRSA PCR Screening     Status: None   Collection Time: 12/12/15  7:56 PM  Result Value Ref Range Status   MRSA by PCR NEGATIVE NEGATIVE Final    Comment:        The GeneXpert MRSA Assay (FDA approved for NASAL specimens only), is one component of a comprehensive MRSA colonization surveillance program. It is not intended to diagnose MRSA infection nor to guide  or monitor treatment for MRSA infections.      Studies:   Recent x-ray studies have been reviewed in detail by the Attending Physician  Scheduled Meds:  Scheduled Meds: . cefTRIAXone (ROCEPHIN)  IV  1 g Intravenous Q24H  . doxycycline (VIBRAMYCIN) IV  100 mg Intravenous Q12H  . enoxaparin (LOVENOX) injection  20 mg Subcutaneous Q24H  . sodium chloride  3 mL Intravenous Q12H    Time spent on care of this patient: 35 mins   MCCLUNG,JEFFREY T , MD   [facility] Office  [phone] Pager - Text Page per Shea Evans as per below:  On-Call/Text Page:      [email or web address]      password [email or web address]  If 7PM-7AM, please contact night-coverage [email or web address] Password [email or web address] 12/13/2015, 12:39 PM   LOS: 1 day

## 2015-12-14 LAB — COMPREHENSIVE METABOLIC PANEL
ALBUMIN: 2.2 g/dL — AB (ref 3.5–5.0)
ALK PHOS: 50 U/L (ref 38–126)
ALT: 12 U/L — ABNORMAL LOW (ref 14–54)
AST: 13 U/L — AB (ref 15–41)
Anion gap: 7 (ref 5–15)
BILIRUBIN TOTAL: 0.3 mg/dL (ref 0.3–1.2)
CALCIUM: 7.7 mg/dL — AB (ref 8.9–10.3)
CO2: 23 mmol/L (ref 22–32)
CREATININE: 0.56 mg/dL (ref 0.44–1.00)
Chloride: 112 mmol/L — ABNORMAL HIGH (ref 101–111)
GFR calc Af Amer: >60 mL/min (ref >60)
GFR calc non Af Amer: >60 mL/min (ref >60)
GLUCOSE: 84 mg/dL (ref 65–99)
Potassium: 3.2 mmol/L — ABNORMAL LOW (ref 3.5–5.1)
Sodium: 142 mmol/L (ref 135–145)
TOTAL PROTEIN: 5 g/dL — AB (ref 6.5–8.1)

## 2015-12-14 LAB — CBC
HCT: 32 % — ABNORMAL LOW (ref 36.0–46.0)
Hemoglobin: 10.7 g/dL — ABNORMAL LOW (ref 12.0–15.0)
MCH: 29.7 pg (ref 26.0–34.0)
MCHC: 33.4 g/dL (ref 30.0–36.0)
MCV: 88.9 fL (ref 78.0–100.0)
PLATELETS: 119 10*3/uL — AB (ref 150–400)
RBC: 3.6 MIL/uL — ABNORMAL LOW (ref 3.87–5.11)
RDW: 12.6 % (ref 11.5–15.5)
WBC: 8.3 10*3/uL (ref 4.0–10.5)

## 2015-12-14 LAB — TSH: TSH: 1.228 u[IU]/mL (ref 0.350–4.500)

## 2015-12-14 MED ORDER — SENNOSIDES-DOCUSATE SODIUM 8.6-50 MG PO TABS
1.0000 | ORAL_TABLET | Freq: Every evening | ORAL | Status: DC | PRN
Start: 2015-12-14 — End: 2015-12-15

## 2015-12-14 MED ORDER — POTASSIUM CHLORIDE CRYS ER 20 MEQ PO TBCR
40.0000 meq | EXTENDED_RELEASE_TABLET | Freq: Once | ORAL | Status: AC
  Administered 2015-12-14: 40 meq via ORAL
  Filled 2015-12-14: qty 2

## 2015-12-14 NOTE — Progress Notes (Signed)
Utilization Review Completed.Tracy Garza T12/26/2016  

## 2015-12-14 NOTE — Progress Notes (Signed)
[place] TEAM 1 - Stepdown/ICU TEAM PROGRESS NOTE  Tracy Garza [id number] DOB: 25-Dec-1990 DOA: 12/12/2015 PCP: No PCP Per Patient  Admit HPI / Brief Narrative: 24 y.o. female with history of UTI requiring admission at [facility] 4 years ago who presented with complaints of generalized body aches for 3 days.  She became weak and was unable to walk and stand on her own.  This was accompanied by left-sided pain primarily in the flank.  HPI/Subjective: The pt is resting comfortably.  She denies cp, n/v, or any abdom pain pain. She is tolerating her diet w/o difficulty at this time, though she did have one episode of vomiting early today.    Assessment/Plan:  Sepsis secondary to pansensitive E coli UTI   Temperature >38.5C, Tachycardia, Tachypnea, Leucocytosis at admit with evidence of pyelonephritis - GC/chlamydia still pending (possible not done?) - HIV negative - RPR negative - clinically now appears most c/w pyelonephritis - cont directed abx via IV for today - if stable over night will transition to oral meds in AM and d/c home   Sinus tachycardia Despite hydration and stabilization o/w the pt remains tachycardic - TSH normal - lactate normal - on no meds at home - cont to follow on tele as overall trend is one of improvement   ?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 - no evidence to suggest bowel obstruction or colitis at present - follow clinically - tolerating diet w/o difficulty   Hypokalemia Cont to replace and follow trend   Hyponatremia Due to hypovolemia - resolved w/ volume expansion   Code Status: FULL Family Communication: no family present at time of exam Disposition Plan: probable d/c home in AM, therefore will not transfer pt tonight   Consultants: none  Procedures: none  Antibiotics: Rocephin 12/24 > Doxycycline 12/24 >  DVT prophylaxis: Lovenox  Objective: Blood pressure 107/71, pulse 108, temperature 97.3 F (36.3 C), temperature source  Oral, resp. rate 30, height 4' 9.5" (1.461 m), weight 45.6 kg (100 lb 8.5 oz), last menstrual period 11/27/2015, SpO2 100 %.  Intake/Output Summary (Last 24 hours) at 12/14/15 1514 Last data filed at 12/14/15 1300  Gross per 24 hour  Intake 7320.5 ml  Output   2300 ml  Net 5020.5 ml   Exam: General: No acute respiratory distress  Lungs: Clear to auscultation bilaterally  Cardiovascular: tachycardic but regular - no gallup rub or M Abdomen: no longer tender to palpation - bs+ - no rebound or mass - soft  Extremities: No significant cyanosis, clubbing, edema bilateral lower extremities  Data Reviewed: Basic Metabolic Panel:  Recent Labs Lab 12/12/15 1052 12/12/15 1723 12/13/15 0258 12/14/15 0304  NA 133* 133* 140 142  K 3.4* 3.3* 3.8 3.2*  CL 102 105 108 112*  CO2 22 18* 24 23  GLUCOSE 94 107* 98 84  BUN 8 6 6  <5*  CREATININE 0.76 0.69 0.60 0.56  CALCIUM 8.8* 7.6* 7.9* 7.7*    CBC:  Recent Labs Lab 12/12/15 1052 12/12/15 1723 12/13/15 0258 12/14/15 0304  WBC 14.6* 12.4* 14.6* 8.3  NEUTROABS 13.0* 11.3*  --   --   HGB 12.8 11.5* 12.0 10.7*  HCT 37.5 34.6* 35.8* 32.0*  MCV 88.7 90.1 90.9 88.9  PLT 125* 107* 114* 119*    Liver Function Tests:  Recent Labs Lab 12/12/15 1723 12/13/15 0258 12/14/15 0304  AST 19 15 13*  ALT 13* 12* 12*  ALKPHOS 53 53 50  BILITOT 1.1 0.4 0.3  PROT 5.6* 5.6*  5.0*  ALBUMIN 2.7* 2.4* 2.2*   Coags:  Recent Labs Lab 12/12/15 1723  INR 1.28    Recent Labs Lab 12/12/15 1723  APTT 35    Cardiac Enzymes:  Recent Labs Lab 12/12/15 1723  CKTOTAL 44    Recent Results (from the past 240 hour(s))  Urine culture     Status: None (Preliminary result)   Collection Time: 12/12/15 10:35 AM  Result Value Ref Range Status   Specimen Description URINE, CLEAN CATCH  Final   Special Requests Normal  Final   Culture >=100,000 COLONIES/mL ESCHERICHIA COLI  Final   Report Status PENDING  Incomplete   Organism ID, Bacteria  ESCHERICHIA COLI  Final      Susceptibility   Escherichia coli - MIC*    AMPICILLIN <=2 SENSITIVE Sensitive     CEFAZOLIN <=4 SENSITIVE Sensitive     CEFTRIAXONE <=1 SENSITIVE Sensitive     CIPROFLOXACIN <=0.25 SENSITIVE Sensitive     GENTAMICIN <=1 SENSITIVE Sensitive     IMIPENEM <=0.25 SENSITIVE Sensitive     NITROFURANTOIN <=16 SENSITIVE Sensitive     TRIMETH/SULFA <=20 SENSITIVE Sensitive     AMPICILLIN/SULBACTAM <=2 SENSITIVE Sensitive     PIP/TAZO <=4 SENSITIVE Sensitive     * >=100,000 COLONIES/mL ESCHERICHIA COLI  Wet prep, genital     Status: Abnormal   Collection Time: 12/12/15 12:56 PM  Result Value Ref Range Status   Yeast Wet Prep HPF POC NONE SEEN NONE SEEN Final   Trich, Wet Prep NONE SEEN NONE SEEN Final   Clue Cells Wet Prep HPF POC NONE SEEN NONE SEEN Final   WBC, Wet Prep HPF POC MANY (A) NONE SEEN Final   Sperm NONE SEEN  Final  Culture, blood (x 2)     Status: None (Preliminary result)   Collection Time: 12/12/15  5:15 PM  Result Value Ref Range Status   Specimen Description BLOOD LEFT ARM  Final   Special Requests BOTTLES DRAWN AEROBIC AND ANAEROBIC 5CC  Final   Culture NO GROWTH 2 DAYS  Final   Report Status PENDING  Incomplete  Culture, blood (x 2)     Status: None (Preliminary result)   Collection Time: 12/12/15  5:19 PM  Result Value Ref Range Status   Specimen Description BLOOD LEFT HAND  Final   Special Requests BOTTLES DRAWN AEROBIC AND ANAEROBIC 5CC  Final   Culture NO GROWTH 2 DAYS  Final   Report Status PENDING  Incomplete  MRSA PCR Screening     Status: None   Collection Time: 12/12/15  7:56 PM  Result Value Ref Range Status   MRSA by PCR NEGATIVE NEGATIVE Final    Comment:        The GeneXpert MRSA Assay (FDA approved for NASAL specimens only), is one component of a comprehensive MRSA colonization surveillance program. It is not intended to diagnose MRSA infection nor to guide or monitor treatment for MRSA infections.       Studies:   Recent x-ray studies have been reviewed in detail by the Attending Physician  Scheduled Meds:  Scheduled Meds: . cefTRIAXone (ROCEPHIN)  IV  1 g Intravenous Q24H  . doxycycline (VIBRAMYCIN) IV  100 mg Intravenous Q12H  . enoxaparin (LOVENOX) injection  20 mg Subcutaneous Q24H  . senna-docusate  1 tablet Oral BID  . sodium chloride  3 mL Intravenous Q12H    Time spent on care of this patient: 35 mins   Stina Gane T , MD   [facility]  Office  [phone] Pager - Text Page per Shea Evans as per below:  On-Call/Text Page:      [email or web address]      password [email or web address]  If 7PM-7AM, please contact night-coverage [email or web address] Password [email or web address] 12/14/2015, 3:14 PM   LOS: 2 days

## 2015-12-14 NOTE — Progress Notes (Signed)
Nutrition Brief Note  Patient identified on the Malnutrition Screening Tool (MST) Report  Wt Readings from Last 15 Encounters:  12/14/15 100 lb 8.5 oz (45.6 kg)   24 y.o. female with history of UTI requiring admission at [facility] 4 years ago who presented with complaints of generalized body aches for 3 days. She became weak and was unable to walk and stand on her own. This was accompanied by left-sided pain primarily in the flank.  Pt admitted with sepsis secondary to UTI.  Pt was unavailable at times of visits (x 3). Pt was either receiving nursing care or using bedside commode at times of visits. Noted pt had consumed about 50% of breakfast tray.  Body mass index is 21.36 kg/(m^2). Patient meets criteria for normal weight range based on current BMI.   Current diet order is regular, patient is consuming approximately 50% of meals at this time. Labs and medications reviewed.   No nutrition interventions warranted at this time. If nutrition issues arise, please consult RD.   Shareen Capwell A. Jimmye Lightsey, RD, LDN, CDE Pager: [phone] After hours Pager: [phone]

## 2015-12-15 LAB — CBC
HEMATOCRIT: 30.2 % — AB (ref 36.0–46.0)
HEMOGLOBIN: 10.6 g/dL — AB (ref 12.0–15.0)
MCH: 30.5 pg (ref 26.0–34.0)
MCHC: 35.1 g/dL (ref 30.0–36.0)
MCV: 87 fL (ref 78.0–100.0)
Platelets: 151 10*3/uL (ref 150–400)
RBC: 3.47 MIL/uL — ABNORMAL LOW (ref 3.87–5.11)
RDW: 12.5 % (ref 11.5–15.5)
WBC: 7.4 10*3/uL (ref 4.0–10.5)

## 2015-12-15 LAB — BASIC METABOLIC PANEL
ANION GAP: 7 (ref 5–15)
BUN: <5 mg/dL — ABNORMAL LOW (ref 6–20)
CHLORIDE: 110 mmol/L (ref 101–111)
CO2: 22 mmol/L (ref 22–32)
Calcium: 8.2 mg/dL — ABNORMAL LOW (ref 8.9–10.3)
Creatinine, Ser: 0.51 mg/dL (ref 0.44–1.00)
GFR calc Af Amer: >60 mL/min (ref >60)
GLUCOSE: 86 mg/dL (ref 65–99)
POTASSIUM: 3.6 mmol/L (ref 3.5–5.1)
Sodium: 139 mmol/L (ref 135–145)

## 2015-12-15 LAB — CORTISOL: Cortisol, Plasma: 3.3 ug/dL

## 2015-12-15 MED ORDER — AZITHROMYCIN 500 MG PO TABS
1000.0000 mg | ORAL_TABLET | Freq: Once | ORAL | Status: AC
  Administered 2015-12-15: 1000 mg via ORAL
  Filled 2015-12-15: qty 2

## 2015-12-15 MED ORDER — CEFUROXIME AXETIL 500 MG PO TABS: 500.0000 mg | ORAL_TABLET | Freq: Two times a day (BID) | ORAL | Status: DC

## 2015-12-15 MED ORDER — ONDANSETRON HCL 4 MG PO TABS: 4.0000 mg | ORAL_TABLET | Freq: Four times a day (QID) | ORAL | Status: DC | PRN

## 2015-12-15 MED ORDER — CEFUROXIME AXETIL 500 MG PO TABS
500.0000 mg | ORAL_TABLET | Freq: Two times a day (BID) | ORAL | Status: DC
  Administered 2015-12-15: 500 mg via ORAL
  Filled 2015-12-15 (×3): qty 1

## 2015-12-15 NOTE — Significant Event (Signed)
Discharge instruction reviewed with patient. A copy of AVS given to patient. Patient awaiting for transportation from family to arrive at 12pm. All questions answered. Patient has all her belongings at bedside to take home.

## 2015-12-15 NOTE — Discharge Summary (Signed)
DISCHARGE SUMMARY  Tracy Garza  MR#: [id number]  DOB:1990-12-27  Date of Admission: 12/12/2015 Date of Discharge: 12/15/2015  Attending 34 T  Patient's PCP:No PCP Per Patient  Consults:  none  Disposition: D/C home   Follow-up Appts:     Follow-up Information    Follow up with Appointed [facility] Primary Care Doctor In 1 week.   Why:  contact the office on your Medicaid card to arrange follow up with your assigned primary care provider      Tests Needing Follow-up: -no specific tests to follow up - resume usual maintenance and assure pt continues to improve from pyelonephritis   Discharge Diagnoses: Sepsis secondary to pansensitive E coli UTI  Sinus tachycardia ?mobile cecum Hypokalemia Hyponatremia  Initial presentation: 24 y.o. female with history of UTI requiring admission at [facility] 4 years ago who presented with complaints of generalized body aches for 3 days. She became weak and was unable to walk and stand on her own. This was accompanied by left-sided pain primarily in the flank.  Hospital Course:  Sepsis secondary to pansensitive E coli UTI  Temperature >38.5C, Tachycardia, Tachypnea, Leucocytosis at admit with evidence of pyelonephritis - GC/chlamydia ordered but never done, though sx not convincing for PID - pt dosed w/ 1g Azithro prior to d/c to assure chlamydia covered (was receiving doxy as inpt) - HIV negative - RPR negative - clinically most c/w pyelonephritis - transitioned to oral ceftin at time of d/c, to complete 10 full days of abx tx - all sepsis sx resolved at time of d/c - pt w/ no further flank pain, moving bowels, and tolerating a full diet w/o trouble   Sinus tachycardia TSH normal - lactate normal - on no meds at home - resolved w/ volume resuscitation    ?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 - no evidence to suggest bowel obstruction or colitis at present - tolerating diet w/o difficulty - having  regular bowel movements - pt advised of this possibility, and educated that she should present to the ED should she develop severe constipation or abdom pain  Hypokalemia Replaced to normal   Hyponatremia Due to hypovolemia - resolved w/ volume expansion     Medication List    TAKE these medications        cefUROXime 500 MG tablet  Commonly known as:  CEFTIN  Take 1 tablet (500 mg total) by mouth 2 (two) times daily with a meal.     ibuprofen 200 MG tablet  Commonly known as:  ADVIL,MOTRIN  Take 400 mg by mouth every 6 (six) hours as needed (pain).     ondansetron 4 MG tablet  Commonly known as:  ZOFRAN  Take 1 tablet (4 mg total) by mouth every 6 (six) hours as needed for nausea.       Day of Discharge BP 115/83 mmHg  Pulse 82  Temp(Src) 98.1 F (36.7 C) (Oral)  Resp 30  Ht 4' 9.5" (1.461 m)  Wt 49.578 kg (109 lb 4.8 oz)  BMI 23.23 kg/m2  SpO2 100%  LMP 11/27/2015  Physical Exam: General: No acute respiratory distress Lungs: Clear to auscultation bilaterally without wheezes or crackles Cardiovascular: Regular rate and rhythm without murmur gallop or rub normal S1 and S2 Abdomen: Nontender, nondistended, soft, bowel sounds positive, no rebound, no ascites, no appreciable mass Extremities: No significant cyanosis, clubbing, or edema bilateral lower extremities  Basic Metabolic Panel:  Recent Labs Lab 12/12/15 1052 12/12/15 1723 12/13/15 0258 12/14/15 0304 12/15/15 [id number]  NA 133* 133* 140 142 139  K 3.4* 3.3* 3.8 3.2* 3.6  CL 102 105 108 112* 110  CO2 22 18* 24 23 22   GLUCOSE 94 107* 98 84 86  BUN 8 6 6  <5* <5*  CREATININE 0.76 0.69 0.60 0.56 0.51  CALCIUM 8.8* 7.6* 7.9* 7.7* 8.2*    Liver Function Tests:  Recent Labs Lab 12/12/15 1723 12/13/15 0258 12/14/15 0304  AST 19 15 13*  ALT 13* 12* 12*  ALKPHOS 53 53 50  BILITOT 1.1 0.4 0.3  PROT 5.6* 5.6* 5.0*  ALBUMIN 2.7* 2.4* 2.2*    Coags:  Recent Labs Lab 12/12/15 1723  INR 1.28    CBC:  Recent Labs Lab 12/12/15 1052 12/12/15 1723 12/13/15 0258 12/14/15 0304 12/15/15 0203  WBC 14.6* 12.4* 14.6* 8.3 7.4  NEUTROABS 13.0* 11.3*  --   --   --   HGB 12.8 11.5* 12.0 10.7* 10.6*  HCT 37.5 34.6* 35.8* 32.0* 30.2*  MCV 88.7 90.1 90.9 88.9 87.0  PLT 125* 107* 114* 119* 151    Cardiac Enzymes:  Recent Labs Lab 12/12/15 1723  CKTOTAL 44    Recent Results (from the past 240 hour(s))  Urine culture     Status: None (Preliminary result)   Collection Time: 12/12/15 10:35 AM  Result Value Ref Range Status   Specimen Description URINE, CLEAN CATCH  Final   Special Requests Normal  Final   Culture >=100,000 COLONIES/mL ESCHERICHIA COLI  Final   Report Status PENDING  Incomplete   Organism ID, Bacteria ESCHERICHIA COLI  Final      Susceptibility   Escherichia coli - MIC*    AMPICILLIN <=2 SENSITIVE Sensitive     CEFAZOLIN <=4 SENSITIVE Sensitive     CEFTRIAXONE <=1 SENSITIVE Sensitive     CIPROFLOXACIN <=0.25 SENSITIVE Sensitive     GENTAMICIN <=1 SENSITIVE Sensitive     IMIPENEM <=0.25 SENSITIVE Sensitive     NITROFURANTOIN <=16 SENSITIVE Sensitive     TRIMETH/SULFA <=20 SENSITIVE Sensitive     AMPICILLIN/SULBACTAM <=2 SENSITIVE Sensitive     PIP/TAZO <=4 SENSITIVE Sensitive     * >=100,000 COLONIES/mL ESCHERICHIA COLI  Wet prep, genital     Status: Abnormal   Collection Time: 12/12/15 12:56 PM  Result Value Ref Range Status   Yeast Wet Prep HPF POC NONE SEEN NONE SEEN Final   Trich, Wet Prep NONE SEEN NONE SEEN Final   Clue Cells Wet Prep HPF POC NONE SEEN NONE SEEN Final   WBC, Wet Prep HPF POC MANY (A) NONE SEEN Final   Sperm NONE SEEN  Final  Culture, blood (x 2)     Status: None (Preliminary result)   Collection Time: 12/12/15  5:15 PM  Result Value Ref Range Status   Specimen Description BLOOD LEFT ARM  Final   Special Requests BOTTLES DRAWN AEROBIC AND ANAEROBIC 5CC  Final   Culture NO GROWTH 3 DAYS  Final   Report Status PENDING  Incomplete   Culture, blood (x 2)     Status: None (Preliminary result)   Collection Time: 12/12/15  5:19 PM  Result Value Ref Range Status   Specimen Description BLOOD LEFT HAND  Final   Special Requests BOTTLES DRAWN AEROBIC AND ANAEROBIC 5CC  Final   Culture NO GROWTH 3 DAYS  Final   Report Status PENDING  Incomplete  MRSA PCR Screening     Status: None   Collection Time: 12/12/15  7:56 PM  Result Value Ref Range Status  MRSA by PCR NEGATIVE NEGATIVE Final    Comment:        The GeneXpert MRSA Assay (FDA approved for NASAL specimens only), is one component of a comprehensive MRSA colonization surveillance program. It is not intended to diagnose MRSA infection nor to guide or monitor treatment for MRSA infections.     Time spent in discharge (includes decision making & examination of pt): >35 minutes  12/15/2015, 9:45 AM   Cherene Altes, MD [facility] Office  [phone] Pager [phone]  On-Call/Text Page:      [email or web address]      password [facility]

## 2015-12-15 NOTE — Progress Notes (Signed)
md req appt w [facility]. They are not giving any post hosp appts and no appts avail til sometime in march and will not give appt. Spoke w pt who has medicaid. She has seen md name on her card that she has been assigned to for pcp but was not awate that that was to be her pcp. 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 She and medicaid are only ones that can change her pcp.

## 2015-12-15 NOTE — Discharge Instructions (Signed)

## 2015-12-16 LAB — URINE CULTURE: Special Requests: NORMAL

## 2015-12-17 LAB — CULTURE, BLOOD (ROUTINE X 2)
CULTURE: NO GROWTH
Culture: NO GROWTH

## 2015-12-18 LAB — GC/CHLAMYDIA PROBE AMP (~~LOC~~) NOT AT ARMC
Chlamydia: NEGATIVE
Neisseria Gonorrhea: NEGATIVE

## 2015-12-20 DIAGNOSIS — L932 Other local lupus erythematosus: Secondary | ICD-10-CM

## 2015-12-20 HISTORY — DX: Other local lupus erythematosus: L93.2

## 2016-11-03 ENCOUNTER — Other Ambulatory Visit: Payer: Self-pay

## 2018-01-25 ENCOUNTER — Other Ambulatory Visit: Payer: Self-pay

## 2018-01-25 ENCOUNTER — Emergency Department (HOSPITAL_COMMUNITY)
Admission: EM | Admit: 2018-01-25 | Discharge: 2018-01-26 | Disposition: A | Discharge status: Home / Self Care | Payer: BLUE CROSS/BLUE SHIELD | Attending: Emergency Medicine | Admitting: Emergency Medicine

## 2018-01-25 ENCOUNTER — Encounter (HOSPITAL_COMMUNITY): Payer: Self-pay | Admitting: Emergency Medicine

## 2018-01-25 DIAGNOSIS — J101 Influenza due to other identified influenza virus with other respiratory manifestations: Secondary | ICD-10-CM | POA: Diagnosis not present

## 2018-01-25 DIAGNOSIS — F17210 Nicotine dependence, cigarettes, uncomplicated: Secondary | ICD-10-CM | POA: Diagnosis not present

## 2018-01-25 DIAGNOSIS — R6889 Other general symptoms and signs: Secondary | ICD-10-CM

## 2018-01-25 DIAGNOSIS — R509 Fever, unspecified: Secondary | ICD-10-CM | POA: Diagnosis present

## 2018-01-25 LAB — I-STAT BETA HCG BLOOD, ED (MC, WL, AP ONLY)

## 2018-01-25 MED ORDER — ACETAMINOPHEN 325 MG PO TABS
650.0000 mg | ORAL_TABLET | Freq: Once | ORAL | Status: AC | PRN
  Administered 2018-01-25: 650 mg via ORAL
  Filled 2018-01-25: qty 2

## 2018-01-25 MED ORDER — SODIUM CHLORIDE 0.9 % IV BOLUS (SEPSIS): 500.0000 mL | Freq: Once | INTRAVENOUS | Status: DC

## 2018-01-25 MED ORDER — SODIUM CHLORIDE 0.9 % IV BOLUS (SEPSIS)
1000.0000 mL | Freq: Once | INTRAVENOUS | Status: AC
  Administered 2018-01-25: 1000 mL via INTRAVENOUS

## 2018-01-25 MED ORDER — KETOROLAC TROMETHAMINE 30 MG/ML IJ SOLN
30.0000 mg | Freq: Once | INTRAMUSCULAR | Status: AC
  Administered 2018-01-26: 30 mg via INTRAVENOUS
  Filled 2018-01-25: qty 1

## 2018-01-25 NOTE — ED Triage Notes (Signed)
Pt reports generalized body aches with fever starting this morning.  Pt reports taking sudafed with some relief of HA. Pt tearful in triage, noted to be febrile.

## 2018-01-25 NOTE — ED Notes (Signed)
Pt st's she started to feel bad yesterday when started having body aches with elevated temp this am

## 2018-01-26 LAB — INFLUENZA PANEL BY PCR (TYPE A & B)
Influenza A By PCR: POSITIVE — AB
Influenza B By PCR: NEGATIVE

## 2018-01-26 MED ORDER — BENZONATATE 100 MG PO CAPS: 100.0000 mg | ORAL_CAPSULE | Freq: Three times a day (TID) | ORAL | 0 refills | Status: DC

## 2018-01-26 NOTE — ED Notes (Signed)
Pt tolerates water well.

## 2018-01-26 NOTE — ED Notes (Signed)
Pt ambulated in hall to restroom and back. No assistance. Pt is steady and feels strong on feet. No dizzy feelings.

## 2018-01-26 NOTE — ED Provider Notes (Signed)
[facility] EMERGENCY DEPARTMENT Provider Note   CSN: [id number] Arrival date & time: 01/25/18  1708     History   Chief Complaint Chief Complaint  Patient presents with  . Fever  . Generalized Body Aches    HPI Tracy Garza is a 27 y.o. female who presents for evaluation of generalized body aches, nasal congestion, rhinorrhea that began this morning.  Patient reports that she was at work when she started feeling symptoms.  Patient states that she went home and states that while at home, she developed subjective fever/chills, generalized body aches, cough, and worsening nasal congestion.  Patient reports that she has not taken any medication for the pain.  Patient reports that she felt slightly nauseous but has not had any vomiting.  She is been able to tolerate p.o. without any difficulty.  Patient states that she did not get her flu shot this year.  She denies any sick contacts.  Patient denies any chest pain, difficulty breathing, abdominal pain, vomiting.  The history is provided by the patient.    History reviewed. No pertinent past medical history.  Patient Active Problem List   Diagnosis Date Noted  . Pyelonephritis 12/12/2015    History reviewed. No pertinent surgical history.  OB History    No data available       Home Medications    Prior to Admission medications   Medication Sig Start Date End Date Taking? Authorizing Provider  benzonatate (TESSALON) 100 MG capsule Take 1 capsule (100 mg total) by mouth every 8 (eight) hours. 01/26/18   Volanda Napoleon, PA-C  cefUROXime (CEFTIN) 500 MG tablet Take 1 tablet (500 mg total) by mouth 2 (two) times daily with a meal. 12/15/15   Cherene Altes, MD  ibuprofen (ADVIL,MOTRIN) 200 MG tablet Take 400 mg by mouth every 6 (six) hours as needed (pain).    [provider]  ondansetron (ZOFRAN) 4 MG tablet Take 1 tablet (4 mg total) by mouth every 6 (six) hours as needed for nausea. 12/15/15    Cherene Altes, MD    Family History No family history on file.  Social History Social History   Tobacco Use  . Smoking status: Current Every Day Smoker    Packs/day: 0.25    Types: Cigarettes  . Smokeless tobacco: Never Used  Substance Use Topics  . Alcohol use: Yes    Comment: occasionally  . Drug use: No     Allergies   Morphine and related and Strawberry (diagnostic)   Review of Systems Review of Systems  Constitutional: Positive for chills and fever.  HENT: Positive for congestion and rhinorrhea.   Respiratory: Positive for cough. Negative for shortness of breath.   Cardiovascular: Negative for chest pain.  Gastrointestinal: Negative for abdominal pain, diarrhea, nausea and vomiting.  Genitourinary: Negative for dysuria and hematuria.  Musculoskeletal: Positive for myalgias. Negative for back pain and neck pain.  Skin: Negative for rash.  Neurological: Negative for dizziness, weakness, numbness and headaches.  All other systems reviewed and are negative.    Physical Exam Updated Vital Signs BP 106/65 (BP Location: Right Arm)   Pulse 80   Temp 98.2 F (36.8 C)   Resp 14   Ht 4\' 11"  (1.499 m)   Wt 50.8 kg (112 lb)   LMP 01/15/2018 (Exact Date)   SpO2 100%   BMI 22.62 kg/m   Physical Exam  Constitutional: She is oriented to person, place, and time. She appears well-developed and  well-nourished.  HENT:  Head: Normocephalic and atraumatic.  Nose: Mucosal edema and rhinorrhea present.  Mouth/Throat: Oropharynx is clear and moist and mucous membranes are normal.  Eyes: Conjunctivae, EOM and lids are normal. Pupils are equal, round, and reactive to light.  Neck: Full passive range of motion without pain. Neck supple. No neck rigidity.  Cardiovascular: Regular rhythm, normal heart sounds and normal pulses. Tachycardia present. Exam reveals no gallop and no friction rub.  No murmur heard. Pulmonary/Chest: Effort normal and breath sounds normal.  No  evidence of respiratory distress. Able to speak in full sentences without difficulty.  Abdominal: Soft. Normal appearance. There is no tenderness. There is no rigidity and no guarding.  Abdomen is soft, nondistended, nontender.  Musculoskeletal: Normal range of motion.  Neurological: She is alert and oriented to person, place, and time.  Skin: Skin is warm and dry. Capillary refill takes less than 2 seconds.  Psychiatric: She has a normal mood and affect. Her speech is normal.  Nursing note and vitals reviewed.    ED Treatments / Results  Labs (all labs ordered are listed, but only abnormal results are displayed) Labs Reviewed  INFLUENZA PANEL BY PCR (TYPE A & B) - Abnormal; Notable for the following components:      Result Value   Influenza A By PCR POSITIVE (*)    All other components within normal limits  I-STAT BETA HCG BLOOD, ED ([facility], [facility], [facility] ONLY)    EKG  EKG Interpretation None       Radiology No results found.  Procedures Procedures (including critical care time)  Medications Ordered in ED Medications  acetaminophen (TYLENOL) tablet 650 mg (650 mg Oral Given 01/25/18 1908)  sodium chloride 0.9 % bolus 1,000 mL (0 mLs Intravenous Stopped 01/26/18 0032)  ketorolac (TORADOL) 30 MG/ML injection 30 mg (30 mg Intravenous Given 01/26/18 0031)     Initial Impression / Assessment and Plan / ED Course  I have reviewed the triage vital signs and the nursing notes.  Pertinent labs & imaging results that were available during my care of the patient were reviewed by me and considered in my medical decision making (see chart for details).     27 year old female who presents for evaluation of fever, generalized body aches, nasal congestion, rhinorrhea that began today.  Patient reports that she did not get her flu shot.  Denies any sick contacts.  On initial ED arrival, patient is febrile, tachycardic.  On my exam, patient is still tachycardic.  No evidence of respiratory distress.   Concern for influenza given history/physical exam.  Repeat vitals show that patient is hypotensive.  Will plan to give fluid bolus here in the department.  Antipyretics given for fever.  Plan to send flu swab.  Repeat vitals are improved after liter bolus of fluid.  Plan to ambulate patient in the department.  Patient is tolerating p.o. in the department without any difficulty.  I-STAT beta is negative.  Patient ambulated without any symptoms.  She reports some chest soreness after coughing.  Repeat abdominal exam shows abdomen is soft, nondistended, nontender.  Lungs clear to auscultation.  Given concern for influenza, will plan to treat for flu.  Influenza panel is pending.  Given that patient is within the 24-hour.  I discussed with her regarding starting Tamiflu.  We discussed at length regarding risk first benefits of starting Tamiflu.  Patient does not wish to start Tamiflu at this time.  Encouraged home supportive care for further relief. Patient had  ample opportunity for questions and discussion. All patient's questions were answered with full understanding. Strict return precautions discussed. Patient expresses understanding and agreement to plan.   Final Clinical Impressions(s) / ED Diagnoses   Final diagnoses:  Flu-like symptoms    ED Discharge Orders        Ordered    benzonatate (TESSALON) 100 MG capsule  Every 8 hours     01/26/18 0045       Volanda Napoleon, PA-C 01/26/18 0059    Fatima Blank, MD 01/27/18 [phone]

## 2018-01-26 NOTE — Discharge Instructions (Signed)
You can take Tylenol or Ibuprofen as directed for pain. You can alternate Tylenol and Ibuprofen every 4 hours. If you take Tylenol at 1pm, then you can take Ibuprofen at 5pm. Then you can take Tylenol again at 9pm.   Make sure you are drinking plenty of fluids and staying hydrated.  Follow-up with your primary care doctor in the next 24-48 hours for further evaluation.  Return the emergency department for any worsening fever, chest pain, difficulty breathing, inability to eat or drink any food or any other worsening or concerning symptoms.

## 2020-01-23 ENCOUNTER — Inpatient Hospital Stay (HOSPITAL_COMMUNITY): Payer: BC Managed Care – PPO

## 2020-01-23 ENCOUNTER — Other Ambulatory Visit: Payer: Self-pay

## 2020-01-23 ENCOUNTER — Encounter (HOSPITAL_COMMUNITY): Payer: Self-pay | Admitting: Family Medicine

## 2020-01-23 ENCOUNTER — Inpatient Hospital Stay (HOSPITAL_COMMUNITY)
Admission: AD | Admit: 2020-01-23 | Discharge: 2020-01-23 | Disposition: A | Discharge status: Home / Self Care | Payer: BC Managed Care – PPO | Attending: Family Medicine | Admitting: Family Medicine

## 2020-01-23 DIAGNOSIS — Z87891 Personal history of nicotine dependence: Secondary | ICD-10-CM | POA: Diagnosis not present

## 2020-01-23 DIAGNOSIS — E876 Hypokalemia: Secondary | ICD-10-CM | POA: Diagnosis not present

## 2020-01-23 DIAGNOSIS — O3680X0 Pregnancy with inconclusive fetal viability, not applicable or unspecified: Secondary | ICD-10-CM

## 2020-01-23 DIAGNOSIS — Z3A01 Less than 8 weeks gestation of pregnancy: Secondary | ICD-10-CM | POA: Insufficient documentation

## 2020-01-23 DIAGNOSIS — O99281 Endocrine, nutritional and metabolic diseases complicating pregnancy, first trimester: Secondary | ICD-10-CM | POA: Diagnosis not present

## 2020-01-23 DIAGNOSIS — Z6790 Unspecified blood type, Rh positive: Secondary | ICD-10-CM

## 2020-01-23 DIAGNOSIS — O4690 Antepartum hemorrhage, unspecified, unspecified trimester: Secondary | ICD-10-CM

## 2020-01-23 DIAGNOSIS — Z885 Allergy status to narcotic agent status: Secondary | ICD-10-CM | POA: Insufficient documentation

## 2020-01-23 DIAGNOSIS — O209 Hemorrhage in early pregnancy, unspecified: Secondary | ICD-10-CM | POA: Diagnosis not present

## 2020-01-23 DIAGNOSIS — D251 Intramural leiomyoma of uterus: Secondary | ICD-10-CM | POA: Diagnosis not present

## 2020-01-23 DIAGNOSIS — O3411 Maternal care for benign tumor of corpus uteri, first trimester: Secondary | ICD-10-CM | POA: Insufficient documentation

## 2020-01-23 LAB — CBC
HCT: 41.1 % (ref 36.0–46.0)
Hemoglobin: 14.2 g/dL (ref 12.0–15.0)
MCH: 31.1 pg (ref 26.0–34.0)
MCHC: 34.5 g/dL (ref 30.0–36.0)
MCV: 89.9 fL (ref 80.0–100.0)
Platelets: 152 10*3/uL (ref 150–400)
RBC: 4.57 MIL/uL (ref 3.87–5.11)
RDW: 11.7 % (ref 11.5–15.5)
WBC: 5.1 10*3/uL (ref 4.0–10.5)
nRBC: 0 % (ref 0.0–0.2)

## 2020-01-23 LAB — WET PREP, GENITAL
Sperm: NONE SEEN
Trich, Wet Prep: NONE SEEN
Yeast Wet Prep HPF POC: NONE SEEN

## 2020-01-23 LAB — ABO/RH: ABO/RH(D): B POS

## 2020-01-23 LAB — URINALYSIS, ROUTINE W REFLEX MICROSCOPIC
Bilirubin Urine: NEGATIVE
Glucose, UA: NEGATIVE mg/dL
Ketones, ur: NEGATIVE mg/dL
Leukocytes,Ua: NEGATIVE
Nitrite: NEGATIVE
Protein, ur: NEGATIVE mg/dL
Specific Gravity, Urine: 1.026 (ref 1.005–1.030)
pH: 6 (ref 5.0–8.0)

## 2020-01-23 LAB — COMPREHENSIVE METABOLIC PANEL
ALT: 17 U/L (ref 0–44)
AST: 21 U/L (ref 15–41)
Albumin: 3.4 g/dL — ABNORMAL LOW (ref 3.5–5.0)
Alkaline Phosphatase: 37 U/L — ABNORMAL LOW (ref 38–126)
Anion gap: 2 — ABNORMAL LOW (ref 5–15)
BUN: 9 mg/dL (ref 6–20)
CO2: 24 mmol/L (ref 22–32)
Calcium: 9 mg/dL (ref 8.9–10.3)
Chloride: 107 mmol/L (ref 98–111)
Creatinine, Ser: 0.66 mg/dL (ref 0.44–1.00)
GFR calc Af Amer: >60 mL/min (ref >60)
GFR calc non Af Amer: >60 mL/min (ref >60)
Glucose, Bld: 122 mg/dL — ABNORMAL HIGH (ref 70–99)
Potassium: 3.3 mmol/L — ABNORMAL LOW (ref 3.5–5.1)
Sodium: 133 mmol/L — ABNORMAL LOW (ref 135–145)
Total Bilirubin: 0.5 mg/dL (ref 0.3–1.2)
Total Protein: 6.2 g/dL — ABNORMAL LOW (ref 6.5–8.1)

## 2020-01-23 LAB — POCT PREGNANCY, URINE: Preg Test, Ur: POSITIVE — AB

## 2020-01-23 LAB — HCG, QUANTITATIVE, PREGNANCY: hCG, Beta Chain, Quant, S: 2570 m[IU]/mL — ABNORMAL HIGH (ref <5)

## 2020-01-23 MED ORDER — POTASSIUM CHLORIDE ER 10 MEQ PO TBCR: 10.0000 meq | EXTENDED_RELEASE_TABLET | Freq: Every day | ORAL | 0 refills | Status: DC

## 2020-01-23 MED ORDER — ACETAMINOPHEN 500 MG PO TABS
1000.0000 mg | ORAL_TABLET | Freq: Once | ORAL | Status: AC
  Administered 2020-01-23: 1000 mg via ORAL
  Filled 2020-01-23: qty 2

## 2020-01-23 NOTE — ED Notes (Signed)
Joy, PA to triage to assess patient and need to go to MAU. Patient cleared by ED PA and accepted to MAU by their APP. Report given to MAU charge. Transport called to take patient over.

## 2020-01-23 NOTE — MAU Provider Note (Signed)
History     CSN: [id number]  Arrival date and time: 01/23/20 1730   First Provider Initiated Contact with Patient 01/23/20 2027      Chief Complaint  Patient presents with  . Vaginal Bleeding   [name] is a 29 y.o. [id number] at [redacted]w[redacted]d who presents to MAU for vaginal bleeding which began yesterday. Pt describes bleeding as spotting when wiping yesterday. Pt reports bleeding was link pink yesterday, but today is bright red. Pt also reports "really bad headache" that she has not attempted treatment for. Pt reports that she has not had anything to eat all day Pt denies history of headache issues. Pt rates HA as 8/10.  Passing blood clots? no Blood soaking clothes? no Lightheaded/dizzy? no Significant pelvic pain or cramping? no, pt describes sensation as pressure across the whole pelvis and inside umbilicus Passed any tissue? no Hx ectopic pregnancy? no Hx of PID, GYN surgery? no Hx of recurrent pregnancy loss/associated condition? no  Current pregnancy problems? pt has not yet been seen Blood Type? B Positive Allergies? Morphine, Strawberries Current medications? multi-vitamins Current PNC & next appt? none  Pt denies vaginal discharge/odor/itching. Pt denies N/V, abdominal pain, constipation, diarrhea, or urinary problems. Pt denies fever, chills, fatigue, sweating or changes in appetite. Pt denies SOB or chest pain. Pt denies dizziness, light-headedness, weakness.   OB History    Gravida  7   Para  2   Term  2   Preterm      AB  4   Living  2     SAB      TAB  4   Ectopic      Multiple      Live Births  2           Past Medical History:  Diagnosis Date  . Cutaneous lupus erythematosus 2017    History reviewed. No pertinent surgical history.  Family History  Problem Relation Age of Onset  . Fibromyalgia Mother   . Multiple sclerosis Sister   . Multiple sclerosis Maternal Uncle   . Cancer Maternal Grandmother   . Lupus Paternal  Grandmother     Social History   Tobacco Use  . Smoking status: Former Smoker    Packs/day: 0.25    Types: Cigarettes  . Smokeless tobacco: Never Used  Substance Use Topics  . Alcohol use: Not Currently    Comment: occasionally  . Drug use: No    Allergies:  Allergies  Allergen Reactions  . Morphine And Related Hives  . Strawberry (Diagnostic) Hives and Itching    Medications Prior to Admission  Medication Sig Dispense Refill Last Dose  . benzonatate (TESSALON) 100 MG capsule Take 1 capsule (100 mg total) by mouth every 8 (eight) hours. 21 capsule 0   . cefUROXime (CEFTIN) 500 MG tablet Take 1 tablet (500 mg total) by mouth 2 (two) times daily with a meal. 14 tablet 0   . ibuprofen (ADVIL,MOTRIN) 200 MG tablet Take 400 mg by mouth every 6 (six) hours as needed (pain).     . ondansetron (ZOFRAN) 4 MG tablet Take 1 tablet (4 mg total) by mouth every 6 (six) hours as needed for nausea. 10 tablet 0     Review of Systems  Constitutional: Negative for chills, diaphoresis, fatigue and fever.  Eyes: Negative for visual disturbance.  Respiratory: Negative for shortness of breath.   Cardiovascular: Negative for chest pain.  Gastrointestinal: Negative for abdominal pain, constipation, diarrhea, nausea and vomiting.  Genitourinary: Positive for pelvic pain (pressure) and vaginal bleeding. Negative for dysuria, flank pain, frequency, urgency and vaginal discharge.  Neurological: Positive for headaches. Negative for dizziness, weakness and light-headedness.   Physical Exam   Blood pressure 116/76, pulse 76, temperature 98.8 F (37.1 C), temperature source Oral, resp. rate 12, last menstrual period 12/14/2019, SpO2 99 %.  Patient Vitals for the past 24 hrs:  BP Temp Temp src Pulse Resp SpO2  01/23/20 1834 116/76 -- -- 76 -- --  01/23/20 1832 -- 98.8 F (37.1 C) Oral -- 12 99 %  01/23/20 1749 108/67 98.7 F (37.1 C) -- 93 16 100 %  01/23/20 1736 108/67 98.7 F (37.1 C) Oral 93 16  100 %   Physical Exam  Constitutional: She is oriented to person, place, and time. She appears well-developed and well-nourished. No distress.  HENT:  Head: Normocephalic and atraumatic.  Respiratory: Effort normal.  GI: Soft. She exhibits no distension and no mass. There is no abdominal tenderness. There is no rebound and no guarding.  Genitourinary: There is no rash, tenderness or lesion on the right labia. There is no rash, tenderness or lesion on the left labia. Uterus is not enlarged and not tender. Cervix exhibits no motion tenderness, no discharge and no friability. Right adnexum displays no mass, no tenderness and no fullness. Left adnexum displays no mass, no tenderness and no fullness.    Vaginal bleeding (minimal blood present coating vaginal walls, minimal bleeding from external cervical os, dark red, no clots) present.     No vaginal discharge or tenderness.  There is bleeding (minimal blood present coating vaginal walls, minimal bleeding from external cervical os, dark red, no clots) in the vagina. No tenderness in the vagina.    Genitourinary Comments: Cervix closed on bimanual exam.   Neurological: She is alert and oriented to person, place, and time.  Skin: Skin is warm and dry. She is not diaphoretic.  Psychiatric: She has a normal mood and affect. Her behavior is normal. Judgment and thought content normal.   Results for orders placed or performed during the hospital encounter of 01/23/20 (from the past 24 hour(s))  Urinalysis, Routine w reflex microscopic     Status: Abnormal   Collection Time: 01/23/20  6:21 PM  Result Value Ref Range   Color, Urine YELLOW YELLOW   APPearance HAZY (A) CLEAR   Specific Gravity, Urine 1.026 1.005 - 1.030   pH 6.0 5.0 - 8.0   Glucose, UA NEGATIVE NEGATIVE mg/dL   Hgb urine dipstick LARGE (A) NEGATIVE   Bilirubin Urine NEGATIVE NEGATIVE   Ketones, ur NEGATIVE NEGATIVE mg/dL   Protein, ur NEGATIVE NEGATIVE mg/dL   Nitrite NEGATIVE  NEGATIVE   Leukocytes,Ua NEGATIVE NEGATIVE   RBC / HPF 0-5 0 - 5 RBC/hpf   WBC, UA 0-5 0 - 5 WBC/hpf   Bacteria, UA RARE (A) NONE SEEN   Squamous Epithelial / LPF 6-10 0 - 5   Mucus PRESENT   Pregnancy, urine POC     Status: Abnormal   Collection Time: 01/23/20  6:23 PM  Result Value Ref Range   Preg Test, Ur POSITIVE (A) NEGATIVE  CBC     Status: None   Collection Time: 01/23/20  7:09 PM  Result Value Ref Range   WBC 5.1 4.0 - 10.5 K/uL   RBC 4.57 3.87 - 5.11 MIL/uL   Hemoglobin 14.2 12.0 - 15.0 g/dL   HCT 41.1 36.0 - 46.0 %   MCV 89.9 80.0 -  100.0 fL   MCH 31.1 26.0 - 34.0 pg   MCHC 34.5 30.0 - 36.0 g/dL   RDW 11.7 11.5 - 15.5 %   Platelets 152 150 - 400 K/uL   nRBC 0.0 0.0 - 0.2 %  Comprehensive metabolic panel     Status: Abnormal   Collection Time: 01/23/20  7:09 PM  Result Value Ref Range   Sodium 133 (L) 135 - 145 mmol/L   Potassium 3.3 (L) 3.5 - 5.1 mmol/L   Chloride 107 98 - 111 mmol/L   CO2 24 22 - 32 mmol/L   Glucose, Bld 122 (H) 70 - 99 mg/dL   BUN 9 6 - 20 mg/dL   Creatinine, Ser 0.66 0.44 - 1.00 mg/dL   Calcium 9.0 8.9 - 10.3 mg/dL   Total Protein 6.2 (L) 6.5 - 8.1 g/dL   Albumin 3.4 (L) 3.5 - 5.0 g/dL   AST 21 15 - 41 U/L   ALT 17 0 - 44 U/L   Alkaline Phosphatase 37 (L) 38 - 126 U/L   Total Bilirubin 0.5 0.3 - 1.2 mg/dL   GFR calc non Af Amer >60 >60 mL/min   GFR calc Af Amer >60 >60 mL/min   Anion gap 2 (L) 5 - 15  hCG, quantitative, pregnancy     Status: Abnormal   Collection Time: 01/23/20  7:09 PM  Result Value Ref Range   hCG, Beta Chain, Quant, S 2,570 (H) <5 mIU/mL  ABO/Rh     Status: None   Collection Time: 01/23/20  7:09 PM  Result Value Ref Range   ABO/RH(D) B POS    No rh immune globuloin      NOT A RH IMMUNE GLOBULIN CANDIDATE, PT RH POSITIVE Performed at [facility], [address], [place], [place] [address]   Wet prep, genital     Status: Abnormal   Collection Time: 01/23/20  7:11 PM   Specimen: PATH Cytology  Cervicovaginal Ancillary Only  Result Value Ref Range   Yeast Wet Prep HPF POC NONE SEEN NONE SEEN   Trich, Wet Prep NONE SEEN NONE SEEN   Clue Cells Wet Prep HPF POC PRESENT (A) NONE SEEN   WBC, Wet Prep HPF POC FEW (A) NONE SEEN   Sperm NONE SEEN    US OB LESS THAN 14 WEEKS WITH OB TRANSVAGINAL  Result Date: 01/23/2020 CLINICAL DATA:  Pregnant patient with vaginal bleeding. EXAM: OBSTETRIC <14 WK [place] AND TRANSVAGINAL OB US TECHNIQUE: Both transabdominal and transvaginal ultrasound examinations were performed for complete evaluation of the gestation as well as the maternal uterus, adnexal regions, and pelvic cul-de-sac. Transvaginal technique was performed to assess early pregnancy. COMPARISON:  None. FINDINGS: Intrauterine gestational sac: Probable Yolk sac:  Not Visualized. Embryo:  Not Visualized. Cardiac Activity: Not Visualized. MSD: 3.5 mm   5 w   0 d Subchorionic hemorrhage:  None visualized. Maternal uterus/adnexae: Normal right and left ovaries. There is a 1.6 x 1.5 x 1.6 cm intramural fibroid along the posterior uterine body. IMPRESSION: Probable early intrauterine gestational sac, but no yolk sac, fetal pole, or cardiac activity yet visualized. Recommend follow-up quantitative B-HCG levels and follow-up US in 14 days to assess viability. This recommendation follows SRU consensus guidelines: Diagnostic Criteria for Nonviable Pregnancy Early in the First Trimester. [place] [place] Med [id number]. Electronically Signed   By: Lovey Newcomer M.D.   On: 01/23/2020 19:46   MAU Course  Procedures  MDM -r/o ectopic -minimal bleeding without abdominal or  pelvic pain on exam -UA: hazy/lg hgb/rare bacteria, sending urine for culture -CBC: WNL -CMP: Na 133, K 3.3, Anion Gap <2 -[place]: probable early intrauterine gestational sac, but no yolk sac, fetal pole, or cardiac activity yet visualized. -hCG: 2,570 -ABO: B Positive -WetPrep: ClueCells (isolated finding not requiring treatment) -GC/CT  collected -HA 8/10 on admission, Tylenol 1000mg  + food/drink given, pt reports HA now 0/10 -consulted with Dr. Kennon Rounds, [place] for patient to be discharged home with f/u hCG in 48hrs -pt discharged to home in stable condition  Orders Placed This Encounter  Procedures  . Wet prep, genital    Standing Status:   Standing    Number of Occurrences:   1  . Culture, OB Urine    Standing Status:   Standing    Number of Occurrences:   1  . US OB LESS THAN 14 WEEKS WITH OB TRANSVAGINAL    Standing Status:   Standing    Number of Occurrences:   1    Order Specific Question:   Symptom/Reason for Exam    Answer:   Vaginal bleeding in pregnancy [id number]  . Urinalysis, Routine w reflex microscopic    Standing Status:   Standing    Number of Occurrences:   1  . CBC    Standing Status:   Standing    Number of Occurrences:   1  . Comprehensive metabolic panel    Standing Status:   Standing    Number of Occurrences:   1  . hCG, quantitative, pregnancy    Standing Status:   Standing    Number of Occurrences:   1  . Pregnancy, urine POC    Standing Status:   Standing    Number of Occurrences:   1  . ABO/Rh    Standing Status:   Standing    Number of Occurrences:   1  . Discharge patient    Order Specific Question:   Discharge disposition    Answer:   01-Home or Self Care [1]    Order Specific Question:   Discharge patient date    Answer:   01/23/2020   Meds ordered this encounter  Medications  . acetaminophen (TYLENOL) tablet 1,000 mg  . potassium chloride (KLOR-CON) 10 MEQ tablet    Sig: Take 1 tablet (10 mEq total) by mouth daily for 5 days.    Dispense:  5 tablet    Refill:  0    Order Specific Question:   Supervising Provider    Answer:   CONSTANT, PEGGY [[id number]]   Assessment and Plan   1. Pregnancy of unknown anatomic location   2. Vaginal bleeding in pregnancy   3. Blood type, Rh positive   4. Hypokalemia    Allergies as of 01/23/2020      Reactions   Morphine And Related Hives    Strawberry (diagnostic) Hives, Itching      Medication List    STOP taking these medications   ibuprofen 200 MG tablet Commonly known as: ADVIL   ondansetron 4 MG tablet Commonly known as: ZOFRAN     TAKE these medications   benzonatate 100 MG capsule Commonly known as: TESSALON Take 1 capsule (100 mg total) by mouth every 8 (eight) hours.   cefUROXime 500 MG tablet Commonly known as: CEFTIN Take 1 tablet (500 mg total) by mouth 2 (two) times daily with a meal.   potassium chloride 10 MEQ tablet Commonly known as: KLOR-CON Take 1 tablet (10 mEq total) by mouth  daily for 5 days.      -will call with culture results, if positive -RX KDur 12mEq x5days -return to MAU on Saturday 01/25/2020 around 7pm or early Sunday AM for repeat hCG -safe meds in pregnancy list given -strict ectopic/bleeding/pain/return MAU precautions given -pt discharged to home in stable condition  Elmyra Ricks E Meria Crilly 01/23/2020, 10:02 PM

## 2020-01-23 NOTE — ED Provider Notes (Signed)
MSE was initiated and I personally evaluated the patient and placed orders (if any) at  5:57 PM on January 23, 2020.  Was asked to see this pregnant patient prior to her being sent to MAU.  She complains of lower abdominal cramping for the last 2 days.  Spotting vaginal bleeding, using no more than 2 panty liners in a 24-hour period.  Accompanied by nausea and a feeling of overall weakness.  Denies cough, shortness of breath, fever, vomiting, diarrhea, chest pain.  Physical Exam Vitals and nursing note reviewed.  Constitutional:      General: She is not in acute distress.    Appearance: She is well-developed. She is not diaphoretic.  HENT:     Head: Normocephalic and atraumatic.     Mouth/Throat:     Mouth: Mucous membranes are moist.  Eyes:     Conjunctiva/sclera: Conjunctivae normal.  Cardiovascular:     Rate and Rhythm: Normal rate and regular rhythm.     Pulses: Normal pulses.  Pulmonary:     Effort: Pulmonary effort is normal. No respiratory distress.  Abdominal:     Palpations: Abdomen is soft.     Tenderness: There is no abdominal tenderness. There is no guarding.  Musculoskeletal:     Cervical back: Neck supple.  Skin:    General: Skin is warm and dry.  Neurological:     Mental Status: She is alert.  Psychiatric:        Mood and Affect: Mood and affect normal.        Speech: Speech normal.        Behavior: Behavior normal.    5:53 PM Spoke with Lattie Haw, Midwife at [facility]. Discussed patient and her complaints. Agrees to accept patient at MAU.   Vitals:   01/23/20 1736 01/23/20 1749  BP: 108/67 108/67  Pulse: 93 93  Resp: 16 16  Temp: 98.7 F (37.1 C) 98.7 F (37.1 C)  TempSrc: Oral   SpO2: 100% 100%      The patient appears stable so that the remainder of the MSE may be completed by another provider.   Lorayne Bender, PA-C 01/23/20 1801    Hayden Rasmussen, MD 01/24/20 1019

## 2020-01-23 NOTE — Discharge Instructions (Signed)
Uterine Fibroids  Uterine fibroids (leiomyomas) are noncancerous (benign) tumors that can develop in the uterus. Fibroids may also develop in the fallopian tubes, cervix, or tissues (ligaments) near the uterus. You may have one or many fibroids. Fibroids vary in size, weight, and where they grow in the uterus. Some can become quite large. Most fibroids do not require medical treatment. What are the causes? The cause of this condition is not known. What increases the risk? You are more likely to develop this condition if you:  Are in your 30s or 40s and have not gone through menopause.  Have a family history of this condition.  Are of African-American descent.  Had your first period at an early age (early menarche).  Have not had any children (nulliparity).  Are overweight or obese. What are the signs or symptoms? Many women do not have any symptoms. Symptoms of this condition may include:  Heavy menstrual bleeding.  Bleeding or spotting between periods.  Pain and pressure in the pelvic area, between the hips.  Bladder problems, such as needing to urinate urgently or more often than usual.  Inability to have children (infertility).  Failure to carry pregnancy to term (miscarriage). How is this diagnosed? This condition may be diagnosed based on:  Your symptoms and medical history.  A physical exam.  A pelvic exam that includes feeling for any tumors.  Imaging tests, such as ultrasound or MRI. How is this treated? Treatment for this condition may include:  Seeing your health care provider for follow-up visits to monitor your fibroids for any changes.  Taking NSAIDs such as ibuprofen, naproxen, or aspirin to reduce pain.  Hormone medicines. These may be taken as a pill, given in an injection, or delivered by a T-shaped device that is inserted into the uterus (intrauterine device, IUD).  Surgery to remove one of the following: ? The fibroids (myomectomy). Your health  care provider may recommend this if fibroids affect your fertility and you want to become pregnant. ? The uterus (hysterectomy). ? Blood supply to the fibroids (uterine artery embolization). Follow these instructions at home:  Take over-the-counter and prescription medicines only as told by your health care provider.  Ask your health care provider if you should take iron pills or eat more iron-rich foods, such as dark green, leafy vegetables. Heavy menstrual bleeding can cause low iron levels.  If directed, apply heat to your back or abdomen to reduce pain. Use the heat source that your health care provider recommends, such as a moist heat pack or a heating pad. ? Place a towel between your skin and the heat source. ? Leave the heat on for 20-30 minutes. ? Remove the heat if your skin turns bright red. This is especially important if you are unable to feel pain, heat, or cold. You may have a greater risk of getting burned.  Pay close attention to your menstrual cycle. Tell your health care provider about any changes, such as: ? Increased blood flow that requires you to use more pads or tampons than usual. ? A change in the number of days that your period lasts. ? A change in symptoms that are associated with your period, such as back pain or cramps in your abdomen.  Keep all follow-up visits as told by your health care provider. This is important, especially if your fibroids need to be monitored for any changes. Contact a health care provider if you:  Have pelvic pain, back pain, or cramps in your abdomen that   do not get better with medicine or heat.  Develop new bleeding between periods.  Have increased bleeding during or between periods.  Feel unusually tired or weak.  Feel light-headed. Get help right away if you:  Faint.  Have pelvic pain that suddenly gets worse.  Have severe vaginal bleeding that soaks a tampon or pad in 30 minutes or less. Summary  Uterine fibroids are  noncancerous (benign) tumors that can develop in the uterus.  The exact cause of this condition is not known.  Most fibroids do not require medical treatment unless they affect your ability to have children (fertility).  Contact a health care provider if you have pelvic pain, back pain, or cramps in your abdomen that do not get better with medicines.  Make sure you know what symptoms should cause you to get help right away. This information is not intended to replace advice given to you by your health care provider. Make sure you discuss any questions you have with your health care provider. Document Revised: 11/17/2017 Document Reviewed: 10/31/2017 Elsevier Patient Education  [place]. Ectopic Pregnancy  An ectopic pregnancy is when the fertilized egg attaches (implants) outside the uterus. Most ectopic pregnancies occur in one of the tubes where eggs travel from the ovary to the uterus (fallopian tubes), but the implanting can occur in other locations. In rare cases, ectopic pregnancies occur on the ovary, intestine, pelvis, abdomen, or cervix. In an ectopic pregnancy, the fertilized egg does not have the ability to develop into a normal, healthy baby. A ruptured ectopic pregnancy is one in which tearing or bursting of a fallopian tube causes internal bleeding. Often, there is intense lower abdominal pain, and vaginal bleeding sometimes occurs. Having an ectopic pregnancy can be life-threatening. If this dangerous condition is not treated, it can lead to blood loss, shock, or even death. What are the causes? The most common cause of this condition is damage to one of the fallopian tubes. A fallopian tube may be narrowed or blocked, and that keeps the fertilized egg from reaching the uterus. What increases the risk? This condition is more likely to develop in women of childbearing age who have different levels of risk. The levels of risk can be divided into three categories. High  risk  You have gone through infertility treatment.  You have had an ectopic pregnancy before.  You have had surgery on the fallopian tubes, or another surgical procedure, such as an abortion.  You have had surgery to have the fallopian tubes tied (tubal ligation).  You have problems or diseases of the fallopian tubes.  You have been exposed to diethylstilbestrol (DES). This medicine was used until 1971, and it had effects on babies whose mothers took the medicine.  You become pregnant while using an IUD (intrauterine device) for birth control. Moderate risk  You have a history of infertility.  You have had an STI (sexually transmitted infection).  You have a history of pelvic inflammatory disease (PID).  You have scarring from endometriosis.  You have multiple sexual partners.  You smoke. Low risk  You have had pelvic surgery.  You use vaginal douches.  You became sexually active before age 31. What are the signs or symptoms? Common symptoms of this condition include normal pregnancy symptoms, such as missing a period, nausea, tiredness, abdominal pain, breast tenderness, and bleeding. However, ectopic pregnancy will have additional symptoms, such as:  Pain with intercourse.  Irregular vaginal bleeding or spotting.  Cramping or pain on one  side or in the lower abdomen.  Fast heartbeat, low blood pressure, and sweating.  Passing out while having a bowel movement. Symptoms of a ruptured ectopic pregnancy and internal bleeding may include:  Sudden, severe pain in the abdomen and pelvis.  Dizziness, weakness, light-headedness, or fainting.  Pain in the shoulder or neck area. How is this diagnosed? This condition is diagnosed by:  A pelvic exam to locate pain or a mass in the abdomen.  A pregnancy test. This blood test checks for the presence as well as the specific level of pregnancy hormone in the bloodstream.  Ultrasound. This is performed if a pregnancy  test is positive. In this test, a probe is inserted into the vagina. The probe will detect a fetus, possibly in a location other than the uterus.  Taking a sample of uterus tissue (dilation and curettage, or D&C).  Surgery to perform a visual exam of the inside of the abdomen using a thin, lighted tube that has a tiny camera on the end (laparoscope).  Culdocentesis. This procedure involves inserting a needle at the top of the vagina, behind the uterus. If blood is present in this area, it may indicate that a fallopian tube is torn. How is this treated? This condition is treated with medicine or surgery. Medicine  An injection of a medicine (methotrexate) may be given to cause the pregnancy tissue to be absorbed. This medicine may save your fallopian tube. It may be given if: ? The diagnosis is made early, with no signs of active bleeding. ? The fallopian tube has not ruptured. ? You are considered to be a good candidate for the medicine. Usually, pregnancy hormone blood levels are checked after methotrexate treatment. This is to be sure that the medicine is effective. It may take 4-6 weeks for the pregnancy to be absorbed. Most pregnancies will be absorbed by 3 weeks. Surgery  A laparoscope may be used to remove the pregnancy tissue.  If severe internal bleeding occurs, a larger cut (incision) may be made in the lower abdomen (laparotomy) to remove the fetus and placenta. This is done to stop the bleeding.  Part or all of the fallopian tube may be removed (salpingectomy) along with the fetus and placenta. The fallopian tube may also be repaired during the surgery.  In very rare circumstances, removal of the uterus (hysterectomy) may be required.  After surgery, pregnancy hormone testing may be done to be sure that there is no pregnancy tissue left. Whether your treatment is medicine or surgery, you may receive a Rho (D) immune globulin shot to prevent problems with any future pregnancy.  This shot may be given if:  You are Rh-negative and the baby's father is Rh-positive.  You are Rh-negative and you do not know the Rh type of the baby's father. Follow these instructions at home:  Rest and limit your activity after the procedure for as long as told by your health care provider.  Until your health care provider says that it is safe: ? Do not lift anything that is heavier than 10 lb (4.5 kg), or the limit that your health care provider tells you. ? Avoid physical exercise and any movement that requires effort (is strenuous).  To help prevent constipation: ? Eat a healthy diet that includes fruits, vegetables, and whole grains. ? Drink 6-8 glasses of water per day. Get help right away if:  You develop worsening pain that is not relieved by medicine.  You have: ? A fever or chills. ?  Vaginal bleeding. ? Redness and swelling at the incision site. ? Nausea and vomiting.  You feel dizzy or weak.  You feel light-headed or you faint. This information is not intended to replace advice given to you by your health care provider. Make sure you discuss any questions you have with your health care provider. Document Revised: 11/17/2017 Document Reviewed: 07/06/2016 Elsevier Patient Education  [place]. Threatened Miscarriage  A threatened miscarriage occurs when a woman has vaginal bleeding during the first 20 weeks of pregnancy but the pregnancy has not ended. If you have vaginal bleeding during this time, your health care provider will do tests to make sure you are still pregnant. If the tests show that you are still pregnant and that the developing baby (fetus) inside your uterus is still growing, your condition is considered a threatened miscarriage. A threatened miscarriage does not mean your pregnancy will end, but it does increase the risk of losing your pregnancy (complete miscarriage). What are the causes? The cause of this condition is usually not known. For  women who go on to have a complete miscarriage, the most common cause is an abnormal number of chromosomes in the developing baby. Chromosomes are the structures inside cells that hold all of a person's genetic material. What increases the risk? The following lifestyle factors may increase your risk of a miscarriage in early pregnancy:  Smoking.  Drinking excessive amounts of alcohol or caffeine.  Recreational drug use. The following preexisting health conditions may increase your risk of a miscarriage in early pregnancy:  Polycystic ovary syndrome.  Uterine fibroids.  Infections.  Diabetes mellitus. What are the signs or symptoms? Symptoms of this condition include:  Vaginal bleeding.  Mild abdominal pain or cramps. How is this diagnosed? If you have bleeding with or without abdominal pain before 20 weeks of pregnancy, your health care provider will do tests to check whether you are still pregnant. These will include:  Ultrasound. This test uses sound waves to create images of the inside of your uterus. This allows your health care provider to look at your developing baby and other structures, such as your placenta.  Pelvic exam. This is an internal exam of your vagina and cervix.  Measurement of your baby's heart rate.  Laboratory tests such as blood tests, urine tests, or swabs for infection You may be diagnosed with a threatened miscarriage if:  Ultrasound testing shows that you are still pregnant.  Your baby's heart rate is strong.  A pelvic exam shows that the opening between your uterus and your vagina (cervix) is closed.  Blood tests confirm that you are still pregnant. How is this treated? No treatments have been shown to prevent a threatened miscarriage from going on to a complete miscarriage. However, the right home care is important. Follow these instructions at home:  Get plenty of rest.  Do not have sex or use tampons if you have vaginal bleeding.  Do  not douche.  Do not smoke or use recreational drugs.  Do not drink alcohol.  Avoid caffeine.  Keep all follow-up prenatal visits as told by your health care provider. This is important. Contact a health care provider if:  You have light vaginal bleeding or spotting while pregnant.  You have abdominal pain or cramping.  You have a fever. Get help right away if:  You have heavy vaginal bleeding.  You have blood clots coming from your vagina.  You pass tissue from your vagina.  You leak fluid, or  you have a gush of fluid from your vagina.  You have severe low back pain or abdominal cramps.  You have fever, chills, and severe abdominal pain. Summary  A threatened miscarriage occurs when a woman has vaginal bleeding during the first 20 weeks of pregnancy but the pregnancy has not ended.  The cause of a threatened miscarriage is usually not known.  Symptoms of this condition may include vaginal bleeding and mild abdominal pain or cramps.  No treatments have been shown to prevent a threatened miscarriage from going on to a complete miscarriage.  Keep all follow-up prenatal visits as told by your health care provider. This is important. This information is not intended to replace advice given to you by your health care provider. Make sure you discuss any questions you have with your health care provider. Document Revised: 01/11/2018 Document Reviewed: 03/03/2017 Elsevier Patient Education  [place] Medications in Pregnancy    Acne: Benzoyl Peroxide Salicylic Acid  Backache/Headache: Tylenol: 2 regular strength every 4 hours OR              2 Extra strength every 6 hours  Colds/Coughs/Allergies: Benadryl (alcohol free) 25 mg every 6 hours as needed Breath right strips Claritin Cepacol throat lozenges Chloraseptic throat spray Cold-Eeze- up to three times per day Cough drops, alcohol free Flonase (by prescription  only) Guaifenesin Mucinex Robitussin DM (plain only, alcohol free) Saline nasal spray/drops Sudafed (pseudoephedrine) & Actifed ** use only after [redacted] weeks gestation and if you do not have high blood pressure Tylenol Vicks Vaporub Zinc lozenges Zyrtec   Constipation: Colace Ducolax suppositories Fleet enema Glycerin suppositories Metamucil Milk of magnesia Miralax Senokot Smooth move tea  Diarrhea: Kaopectate Imodium A-D  *NO pepto Bismol  Hemorrhoids: Anusol Anusol HC Preparation H Tucks  Indigestion: Tums Maalox Mylanta Zantac  Pepcid  Insomnia: Benadryl (alcohol free) 25mg  every 6 hours as needed Tylenol PM Unisom, no Gelcaps  Leg Cramps: Tums MagGel  Nausea/Vomiting:  Bonine Dramamine Emetrol Ginger extract Sea bands Meclizine  Nausea medication to take during pregnancy:  Unisom (doxylamine succinate 25 mg tablets) Take one tablet daily at bedtime. If symptoms are not adequately controlled, the dose can be increased to a maximum recommended dose of two tablets daily (1/2 tablet in the morning, 1/2 tablet mid-afternoon and one at bedtime). Vitamin B6 100mg  tablets. Take one tablet twice a day (up to 200 mg per day).  Skin Rashes: Aveeno products Benadryl cream or 25mg  every 6 hours as needed Calamine Lotion 1% cortisone cream  Yeast infection: Gyne-lotrimin 7 Monistat 7   **If taking multiple medications, please check labels to avoid duplicating the same active ingredients **take medication as directed on the label ** Do not exceed 4000 mg of tylenol in 24 hours **Do not take medications that contain aspirin or ibuprofen

## 2020-01-23 NOTE — MAU Note (Signed)
Pt states that she took a home pregnancy test last Friday and states that it was positive.   Pt reports nausea all week and pressure in her stomach.   Pt states she started seeing blood only when wiping yesterday.   Pt reports today needing to wear a panty liner because there is more blood.   Pt reports the blood is bright red.

## 2020-01-24 LAB — CULTURE, OB URINE: Culture: NO GROWTH

## 2020-01-24 LAB — GC/CHLAMYDIA PROBE AMP (~~LOC~~) NOT AT ARMC
Chlamydia: NEGATIVE
Comment: NEGATIVE
Comment: NORMAL
Neisseria Gonorrhea: NEGATIVE

## 2020-01-25 ENCOUNTER — Inpatient Hospital Stay (HOSPITAL_COMMUNITY)
Admission: AD | Admit: 2020-01-25 | Discharge: 2020-01-25 | Disposition: A | Discharge status: Home / Self Care | Payer: BC Managed Care – PPO | Attending: Obstetrics and Gynecology | Admitting: Obstetrics and Gynecology

## 2020-01-25 ENCOUNTER — Other Ambulatory Visit: Payer: Self-pay

## 2020-01-25 DIAGNOSIS — O039 Complete or unspecified spontaneous abortion without complication: Secondary | ICD-10-CM

## 2020-01-25 DIAGNOSIS — Z3A01 Less than 8 weeks gestation of pregnancy: Secondary | ICD-10-CM

## 2020-01-25 LAB — HCG, QUANTITATIVE, PREGNANCY: hCG, Beta Chain, Quant, S: 877 m[IU]/mL — ABNORMAL HIGH (ref <5)

## 2020-01-25 NOTE — Discharge Instructions (Signed)
Miscarriage A miscarriage is the loss of an unborn baby (fetus) before the 20th week of pregnancy. Follow these instructions at home: Medicines   Take over-the-counter and prescription medicines only as told by your doctor.  If you were prescribed antibiotic medicine, take it as told by your doctor. Do not stop taking the antibiotic even if you start to feel better.  Do not take NSAIDs unless your doctor says that this is safe for you. NSAIDs include aspirin and ibuprofen. These medicines can cause bleeding. Activity  Rest as directed. Ask your doctor what activities are safe for you.  Have someone help you at home during this time. General instructions  Write down how many pads you use each day and how soaked they are.  Watch the amount of tissue or clumps of blood (blood clots) that you pass from your vagina. Save any large amounts of tissue for your doctor.  Do not use tampons, douche, or have sex until your doctor approves.  To help you and your partner with the process of grieving, talk with your doctor or seek counseling.  When you are ready, meet with your doctor to talk about steps you should take for your health. Also, talk with your doctor about steps to take to have a healthy pregnancy in the future.  Keep all follow-up visits as told by your doctor. This is important. Contact a doctor if:  You have a fever or chills.  You have vaginal discharge that smells bad.  You have more bleeding. Get help right away if:  You have very bad cramps or pain in your back or belly.  You pass clumps of blood that are walnut-sized or larger from your vagina.  You pass tissue that is walnut-sized or larger from your vagina.  You soak more than 1 regular pad in an hour.  You get light-headed or weak.  You faint (pass out).  You have feelings of sadness that do not go away, or you have thoughts of hurting yourself. Summary  A miscarriage is the loss of an unborn baby before  the 20th week of pregnancy.  Follow your doctor's instructions for home care. Keep all follow-up appointments.  To help you and your partner with the process of grieving, talk with your doctor or seek counseling. This information is not intended to replace advice given to you by your health care provider. Make sure you discuss any questions you have with your health care provider. Document Revised: 03/29/2019 Document Reviewed: 01/10/2017 Elsevier Patient Education  2020 Elsevier Inc.  

## 2020-01-25 NOTE — MAU Provider Note (Signed)
Subjective:  Tracy Garza is a 29 y.o. [phone] at [redacted]w[redacted]d who presents today for FU BHCG. She was seen on 01/23/2020 . Results from that day show no IUP on [place], and HCG 2,570. She reports vaginal bleeding; light now, heavy yesterday. She denies abdominal or pelvic pain.  Objective:  Physical Exam  Nursing note and vitals reviewed. Constitutional: She is oriented to person, place, and time. She appears well-developed and well-nourished. No distress.  HENT:  Head: Normocephalic.  Cardiovascular: Normal rate.  Respiratory: Effort normal.  GI: Soft. There is no tenderness.  Neurological: She is alert and oriented to person, place, and time. Skin: Skin is warm and dry.  Psychiatric: She has a normal mood and affect.   Results for orders placed or performed during the hospital encounter of 01/25/20 (from the past 24 hour(s))  hCG, quantitative, pregnancy     Status: Abnormal   Collection Time: 01/25/20  7:27 PM  Result Value Ref Range   hCG, Beta Chain, Quant, S 877 (H) <5 mIU/mL    Assessment/Plan: Pregnancy of unknown location HCG did not rise appropriately FU in 1 week at [facility], for non stat quant Return to MAU if symptoms worsen B positive blood type  Noni Saupe I, NP 01/25/2020 8:50 PM

## 2020-01-25 NOTE — MAU Note (Signed)
Pt here for follow up hcg. States she passed something yesterday. Has picture on phone. States since she passed the clot/tissue, bleeding has slowed and pain has improved. Has changed pad twice today compared to yesterday when she had to change pad 5-6 times. No pain today.

## 2020-01-31 ENCOUNTER — Other Ambulatory Visit: Payer: BC Managed Care – PPO

## 2020-01-31 ENCOUNTER — Other Ambulatory Visit: Payer: Self-pay

## 2020-01-31 DIAGNOSIS — O039 Complete or unspecified spontaneous abortion without complication: Secondary | ICD-10-CM

## 2020-02-01 LAB — BETA HCG QUANT (REF LAB): hCG Quant: 70 m[IU]/mL

## 2020-02-10 ENCOUNTER — Ambulatory Visit: Payer: BC Managed Care – PPO | Admitting: Obstetrics and Gynecology

## 2020-02-14 ENCOUNTER — Telehealth: Payer: Self-pay

## 2020-02-14 NOTE — Telephone Encounter (Signed)
Pt LVM on nurse triage line requesting a call back. Called pt back, no answer- LVM for her to call back.

## 2020-09-02 ENCOUNTER — Encounter (HOSPITAL_COMMUNITY): Payer: Self-pay

## 2020-09-02 ENCOUNTER — Ambulatory Visit (HOSPITAL_COMMUNITY)
Admission: EM | Admit: 2020-09-02 | Discharge: 2020-09-02 | Disposition: A | Discharge status: Home / Self Care | Payer: BC Managed Care – PPO | Attending: Family Medicine | Admitting: Family Medicine

## 2020-09-02 ENCOUNTER — Other Ambulatory Visit: Payer: Self-pay

## 2020-09-02 DIAGNOSIS — J029 Acute pharyngitis, unspecified: Secondary | ICD-10-CM

## 2020-09-02 DIAGNOSIS — Z20822 Contact with and (suspected) exposure to covid-19: Secondary | ICD-10-CM | POA: Insufficient documentation

## 2020-09-02 DIAGNOSIS — Z87891 Personal history of nicotine dependence: Secondary | ICD-10-CM | POA: Insufficient documentation

## 2020-09-02 LAB — POCT RAPID STREP A, ED / UC: Streptococcus, Group A Screen (Direct): NEGATIVE

## 2020-09-02 MED ORDER — AMOXICILLIN 875 MG PO TABS: 875.0000 mg | ORAL_TABLET | Freq: Two times a day (BID) | ORAL | 0 refills | Status: AC

## 2020-09-02 NOTE — ED Provider Notes (Signed)
[name]   [id number] 09/02/20 Arrival Time: 9470  ASSESSMENT & PLAN:  1. Sore throat      Rapid strep negative but daughter with symptoms consistent with strep and whom I am treating for strep. Will tx mother also. COVID testing sent. See letter/work note on file for self-isolation guidelines. OTC symptom care as needed.   [place].   Why: As needed. Contact information: [place] [address] [phone]               Reviewed expectations re: course of current medical issues. Questions answered. Outlined signs and symptoms indicating need for more acute intervention. Understanding verbalized. After Visit Summary given.   SUBJECTIVE: History from: patient. Tracy Garza is a 29 y.o. female who presents with a sore throat; one day; daughter with a fever and same symptoms. No resp symptoms. Known COVID-19 contact: none. Recent travel: none. Denies: cough and difficulty breathing. Normal PO intake without n/v/d.    OBJECTIVE:  Vitals:   09/02/20 1307  BP: 116/80  Pulse: 72  Resp: 20  Temp: 98.4 F (36.9 C)  TempSrc: Oral  SpO2: 100%    General appearance: alert; no distress Eyes: PERRLA; EOMI; conjunctiva normal HENT: [place]; AT; without nasal congestion; L throat with erythema and tonsil enlargement Neck: supple without LAD Lungs: speaks full sentences without difficulty; unlabored Extremities: no edema Skin: warm and dry Neurologic: normal gait Psychological: alert and cooperative; normal mood and affect  Labs: Results for orders placed or performed during the hospital encounter of 09/02/20  POCT Rapid Strep A (ED/UC)  Result Value Ref Range   Streptococcus, Group A Screen (Direct) NEGATIVE NEGATIVE   Labs Reviewed  SARS CORONAVIRUS 2 (TAT 6-24 HRS)  POCT RAPID STREP A, ED / UC    Imaging: No results found.  Allergies  Allergen Reactions  . Morphine And Related  Hives  . Strawberry (Diagnostic) Hives and Itching    Past Medical History:  Diagnosis Date  . Cutaneous lupus erythematosus 2017   Social History   Socioeconomic History  . Marital status: Single    Spouse name: Not on file  . Number of children: Not on file  . Years of education: Not on file  . Highest education level: Not on file  Occupational History  . Not on file  Tobacco Use  . Smoking status: Former Smoker    Packs/day: 0.25    Types: Cigarettes  . Smokeless tobacco: Never Used  Vaping Use  . Vaping Use: Every day  Substance and Sexual Activity  . Alcohol use: Yes    Alcohol/week: 1.0 standard drink    Types: 1 Glasses of wine per week    Comment: occasionally  . Drug use: No  . Sexual activity: Not Currently  Other Topics Concern  . Not on file  Social History Narrative  . Not on file   Social Determinants of Health   Financial Resource Strain:   . Difficulty of Paying Living Expenses: Not on file  Food Insecurity:   . Worried About Charity fundraiser in the Last Year: Not on file  . Ran Out of Food in the Last Year: Not on file  Transportation Needs:   . Lack of Transportation (Medical): Not on file  . Lack of Transportation (Non-Medical): Not on file  Physical Activity:   . Days of Exercise per Week: Not on file  . Minutes of Exercise per Session: Not  on file  Stress:   . Feeling of Stress : Not on file  Social Connections:   . Frequency of Communication with Friends and Family: Not on file  . Frequency of Social Gatherings with Friends and Family: Not on file  . Attends Religious Services: Not on file  . Active Member of Clubs or Organizations: Not on file  . Attends Archivist Meetings: Not on file  . Marital Status: Not on file  Intimate Partner Violence:   . Fear of Current or Ex-Partner: Not on file  . Emotionally Abused: Not on file  . Physically Abused: Not on file  . Sexually Abused: Not on file   Family History  Problem  Relation Age of Onset  . Fibromyalgia Mother   . Multiple sclerosis Sister   . Multiple sclerosis Maternal Uncle   . Cancer Maternal Grandmother   . Lupus Paternal Grandmother    Past Surgical History:  Procedure Laterality Date  . INDUCED ABORTION       Vanessa Kick, MD 09/02/20 1401

## 2020-09-02 NOTE — ED Triage Notes (Addendum)
Patient has complaints of swelling to the left side of throat that started on yesterday. Patient reports some tenderness upon palpation to the left side. Patient states she has discomfort with eating and swallowing. Patient denies any fever, cough or SOB. Patient reports recent tx for yeast infection. Left tonsil is noted to be edematous with petechiae. Patient reports she has not taken any over the counter medications.

## 2020-09-02 NOTE — Discharge Instructions (Addendum)
You have been tested for COVID-19 today. If your test returns positive, you will receive a phone call from [facility] regarding your results. Negative test results are not called. Both positive and negative results area always visible on MyChart. If you do not have a MyChart account, sign up instructions are provided in your discharge papers. Please do not hesitate to contact us should you have questions or concerns.  You may use over the counter ibuprofen or acetaminophen as needed.  For a sore throat, over the counter products such as [employer] Peroxyl Mouth Sore Rinse or Chloraseptic Sore Throat Spray may provide some temporary relief.   

## 2020-09-03 ENCOUNTER — Ambulatory Visit (HOSPITAL_COMMUNITY)
Admission: EM | Admit: 2020-09-03 | Discharge: 2020-09-03 | Discharge status: Absent without leave | Payer: BC Managed Care – PPO

## 2020-09-03 LAB — SARS CORONAVIRUS 2 (TAT 6-24 HRS): SARS Coronavirus 2: NEGATIVE

## 2021-08-15 ENCOUNTER — Emergency Department (HOSPITAL_COMMUNITY)
Admission: EM | Admit: 2021-08-15 | Discharge: 2021-08-16 | Disposition: A | Discharge status: Home / Self Care | Payer: Managed Care, Other (non HMO) | Attending: Emergency Medicine | Admitting: Emergency Medicine

## 2021-08-15 ENCOUNTER — Other Ambulatory Visit: Payer: Self-pay

## 2021-08-15 ENCOUNTER — Encounter (HOSPITAL_COMMUNITY): Payer: Self-pay | Admitting: Emergency Medicine

## 2021-08-15 DIAGNOSIS — Z20822 Contact with and (suspected) exposure to covid-19: Secondary | ICD-10-CM | POA: Diagnosis not present

## 2021-08-15 DIAGNOSIS — Z87891 Personal history of nicotine dependence: Secondary | ICD-10-CM | POA: Diagnosis not present

## 2021-08-15 DIAGNOSIS — B349 Viral infection, unspecified: Secondary | ICD-10-CM

## 2021-08-15 DIAGNOSIS — R1012 Left upper quadrant pain: Secondary | ICD-10-CM | POA: Diagnosis not present

## 2021-08-15 DIAGNOSIS — R509 Fever, unspecified: Secondary | ICD-10-CM | POA: Diagnosis present

## 2021-08-15 DIAGNOSIS — J1089 Influenza due to other identified influenza virus with other manifestations: Secondary | ICD-10-CM | POA: Insufficient documentation

## 2021-08-15 LAB — URINALYSIS, ROUTINE W REFLEX MICROSCOPIC
Bilirubin Urine: NEGATIVE
Glucose, UA: NEGATIVE mg/dL
Hgb urine dipstick: NEGATIVE
Ketones, ur: 80 mg/dL — AB
Leukocytes,Ua: NEGATIVE
Nitrite: NEGATIVE
Protein, ur: NEGATIVE mg/dL
Specific Gravity, Urine: 1.02 (ref 1.005–1.030)
pH: 6 (ref 5.0–8.0)

## 2021-08-15 LAB — CBC WITH DIFFERENTIAL/PLATELET
Abs Immature Granulocytes: 0.01 10*3/uL (ref 0.00–0.07)
Basophils Absolute: 0 10*3/uL (ref 0.0–0.1)
Basophils Relative: 1 %
Eosinophils Absolute: 0 10*3/uL (ref 0.0–0.5)
Eosinophils Relative: 0 %
HCT: 39.4 % (ref 36.0–46.0)
Hemoglobin: 13.4 g/dL (ref 12.0–15.0)
Immature Granulocytes: 0 %
Lymphocytes Relative: 27 %
Lymphs Abs: 0.8 10*3/uL (ref 0.7–4.0)
MCH: 29.1 pg (ref 26.0–34.0)
MCHC: 34 g/dL (ref 30.0–36.0)
MCV: 85.7 fL (ref 80.0–100.0)
Monocytes Absolute: 0.1 10*3/uL (ref 0.1–1.0)
Monocytes Relative: 2 %
Neutro Abs: 2.1 10*3/uL (ref 1.7–7.7)
Neutrophils Relative %: 70 %
Platelets: UNDETERMINED 10*3/uL (ref 150–400)
RBC: 4.6 MIL/uL (ref 3.87–5.11)
RDW: 12.2 % (ref 11.5–15.5)
Smear Review: UNDETERMINED
WBC: 3 10*3/uL — ABNORMAL LOW (ref 4.0–10.5)
nRBC: 0 % (ref 0.0–0.2)

## 2021-08-15 LAB — COMPREHENSIVE METABOLIC PANEL
ALT: 23 U/L (ref 0–44)
AST: 39 U/L (ref 15–41)
Albumin: 3.3 g/dL — ABNORMAL LOW (ref 3.5–5.0)
Alkaline Phosphatase: 56 U/L (ref 38–126)
Anion gap: 10 (ref 5–15)
BUN: 7 mg/dL (ref 6–20)
CO2: 23 mmol/L (ref 22–32)
Calcium: 9.1 mg/dL (ref 8.9–10.3)
Chloride: 100 mmol/L (ref 98–111)
Creatinine, Ser: 0.75 mg/dL (ref 0.44–1.00)
GFR, Estimated: >60 mL/min (ref >60)
Glucose, Bld: 129 mg/dL — ABNORMAL HIGH (ref 70–99)
Potassium: 3.5 mmol/L (ref 3.5–5.1)
Sodium: 133 mmol/L — ABNORMAL LOW (ref 135–145)
Total Bilirubin: 0.9 mg/dL (ref 0.3–1.2)
Total Protein: 7 g/dL (ref 6.5–8.1)

## 2021-08-15 LAB — I-STAT BETA HCG BLOOD, ED (MC, WL, AP ONLY): I-stat hCG, quantitative: <5 m[IU]/mL (ref <5)

## 2021-08-15 LAB — LIPASE, BLOOD: Lipase: 26 U/L (ref 11–51)

## 2021-08-15 NOTE — ED Provider Notes (Signed)
Emergency Medicine Provider Triage Evaluation Note  Tracy Garza , a 30 y.o. female  was evaluated in triage.  Pt complains of abd pain. Began 3 days ago. Located to Upper and lower left abdomen. Tylenol 2 hours PTA. No CP, SOB, cough, dysuria. No sick contacts  Review of Systems  Positive: Fever, abd pain Negative: Cough, sob, cp, dysuria  Physical Exam  BP 100/69 (BP Location: Right Arm)   Pulse 97   Temp 98.9 F (37.2 C) (Oral)   Resp 16   SpO2 100%  Gen:   Awake, no distress   Resp:  Normal effort  Abd:  Soft MSK:   Moves extremities without difficulty  Other:    Medical Decision Making  Medically screening exam initiated at 9:50 PM.  Appropriate orders placed.  Tracy Garza was informed that the remainder of the evaluation will be completed by another provider, this initial triage assessment does not replace that evaluation, and the importance of remaining in the ED until their evaluation is complete.  Fever, abdominal pain   Catina Nuss A, PA-C 08/15/21 2153    Gareth Morgan, MD 08/16/21 2332

## 2021-08-15 NOTE — ED Triage Notes (Signed)
Pt reports fever X3 days, left sided abdominal pain, tylenol 2h PTA.

## 2021-08-16 LAB — RESP PANEL BY RT-PCR (FLU A&B, COVID) ARPGX2
Influenza A by PCR: NEGATIVE
Influenza B by PCR: NEGATIVE
SARS Coronavirus 2 by RT PCR: NEGATIVE

## 2021-08-16 MED ORDER — IBUPROFEN 400 MG PO TABS
600.0000 mg | ORAL_TABLET | Freq: Once | ORAL | Status: AC
  Administered 2021-08-16: 600 mg via ORAL
  Filled 2021-08-16: qty 1

## 2021-08-16 MED ORDER — ACETAMINOPHEN 500 MG PO TABS
1000.0000 mg | ORAL_TABLET | Freq: Once | ORAL | Status: AC
  Administered 2021-08-16: 1000 mg via ORAL
  Filled 2021-08-16: qty 2

## 2021-08-16 NOTE — Discharge Instructions (Addendum)
You may take over-the-counter medicine for symptomatic relief, such as Tylenol, Motrin, TheraFlu, Alka seltzer , black elderberry, etc. Please limit acetaminophen (Tylenol) to 4000 mg and Ibuprofen (Motrin, Advil, etc.) to 2400 mg for a 24hr period. Please note that other over-the-counter medicine may contain acetaminophen or ibuprofen as a component of their ingredients.   For pain or fever control you may take at 1000 mg of Tylenol every 8 hours scheduled.  In addition you can alternate with 600 mg of Ibuprofen 4 hours after Tylenol.

## 2021-08-16 NOTE — ED Provider Notes (Signed)
[facility] EMERGENCY DEPARTMENT Provider Note  CSN: [id number] Arrival date & time: 08/15/21 2113  Chief Complaint(s) Fever  HPI Tracy Garza is a 30 y.o. female    Fever Max temp prior to arrival:  26 Temp source:  Oral Severity:  Moderate Onset quality:  Gradual Duration:  3 days Timing:  Intermittent Progression:  Waxing and waning Chronicity:  New Relieved by:  Acetaminophen Worsened by:  Nothing Associated symptoms: myalgias   Associated symptoms: no chest pain, no chills, no congestion, no cough, no diarrhea, no dysuria, no headaches, no rash, no rhinorrhea, no sore throat and no vomiting   Associated symptoms comment:  Left sided abd/flank pain only when the fever is present. No pain otherwise. Risk factors: no sick contacts    Past Medical History Past Medical History:  Diagnosis Date   Cutaneous lupus erythematosus 2017   Patient Active Problem List   Diagnosis Date Noted   Pyelonephritis 12/12/2015   Home Medication(s) Prior to Admission medications   Medication Sig Start Date End Date Taking? Authorizing Provider  fluconazole (DIFLUCAN) 150 MG tablet Take 150 mg by mouth once. 09/01/20   [provider]  NUVARING 0.12-0.015 MG/24HR vaginal ring 1 each. 08/18/20   [provider]  potassium chloride (KLOR-CON) 10 MEQ tablet Take 1 tablet (10 mEq total) by mouth daily for 5 days. 01/23/20 09/02/20  Nugent, Gerrie Nordmann, NP                                                                                                                                    Past Surgical History Past Surgical History:  Procedure Laterality Date   INDUCED ABORTION     Family History Family History  Problem Relation Age of Onset   Fibromyalgia Mother    Multiple sclerosis Sister    Multiple sclerosis Maternal Uncle    Cancer Maternal Grandmother    Lupus Paternal Grandmother     Social History Social History   Tobacco Use   Smoking  status: Former    Packs/day: 0.25    Types: Cigarettes   Smokeless tobacco: Never  Vaping Use   Vaping Use: Every day  Substance Use Topics   Alcohol use: Yes    Alcohol/week: 1.0 standard drink    Types: 1 Glasses of wine per week    Comment: occasionally   Drug use: No   Allergies Morphine and related and Strawberry (diagnostic)  Review of Systems Review of Systems  Constitutional:  Positive for fever. Negative for chills.  HENT:  Negative for congestion, rhinorrhea and sore throat.   Respiratory:  Negative for cough.   Cardiovascular:  Negative for chest pain.  Gastrointestinal:  Negative for diarrhea and vomiting.  Genitourinary:  Negative for dysuria.  Musculoskeletal:  Positive for myalgias.  Skin:  Negative for rash.  Neurological:  Negative for headaches.  All other systems are reviewed and are negative for  acute change except as noted in the HPI  Physical Exam Vital Signs  I have reviewed the triage vital signs BP 114/77   Pulse 98   Temp (!) 101.2 F (38.4 C)   Resp 16   SpO2 98%   Physical Exam Vitals reviewed.  Constitutional:      General: She is not in acute distress.    Appearance: She is well-developed. She is not diaphoretic.  HENT:     Head: Normocephalic and atraumatic.     Nose: Nose normal.  Eyes:     General: No scleral icterus.       Right eye: No discharge.        Left eye: No discharge.     Conjunctiva/sclera: Conjunctivae normal.     Pupils: Pupils are equal, round, and reactive to light.  Cardiovascular:     Rate and Rhythm: Normal rate and regular rhythm.     Heart sounds: No murmur heard.   No friction rub. No gallop.  Pulmonary:     Effort: Pulmonary effort is normal. No respiratory distress.     Breath sounds: Normal breath sounds. No stridor. No rales.  Abdominal:     General: There is no distension.     Palpations: Abdomen is soft.     Tenderness: There is abdominal tenderness in the left upper quadrant. There is left CVA  tenderness. There is no guarding or rebound.  Musculoskeletal:        General: No tenderness.     Cervical back: Normal range of motion and neck supple.  Skin:    General: Skin is warm and dry.     Findings: No erythema or rash.  Neurological:     Mental Status: She is alert and oriented to person, place, and time.    ED Results and Treatments Labs (all labs ordered are listed, but only abnormal results are displayed) Labs Reviewed  CBC WITH DIFFERENTIAL/PLATELET - Abnormal; Notable for the following components:      Result Value   WBC 3.0 (*)    All other components within normal limits  COMPREHENSIVE METABOLIC PANEL - Abnormal; Notable for the following components:   Sodium 133 (*)    Glucose, Bld 129 (*)    Albumin 3.3 (*)    All other components within normal limits  URINALYSIS, ROUTINE W REFLEX MICROSCOPIC - Abnormal; Notable for the following components:   Ketones, ur 80 (*)    All other components within normal limits  RESP PANEL BY RT-PCR (FLU A&B, COVID) ARPGX2  LIPASE, BLOOD  I-STAT BETA HCG BLOOD, ED ([facility], [facility], [facility] ONLY)                                                                                                                         EKG  EKG Interpretation  Date/Time:    Ventricular Rate:    PR Interval:    QRS Duration:   QT Interval:    QTC Calculation:  R Axis:     Text Interpretation:         Radiology No results found.  Pertinent labs & imaging results that were available during my care of the patient were reviewed by me and considered in my medical decision making (see MDM for details).  Medications Ordered in ED Medications  acetaminophen (TYLENOL) tablet 1,000 mg (1,000 mg Oral Given 08/16/21 0352)  ibuprofen (ADVIL) tablet 600 mg (600 mg Oral Given 08/16/21 0523)                                                                                                                                     Procedures Procedures  (including critical  care time)  Medical Decision Making / ED Course I have reviewed the nursing notes for this encounter and the patient's prior records (if available in EHR or on provided paperwork).  Tracy Garza was evaluated in Emergency Department on 08/16/2021 for the symptoms described in the history of present illness. She was evaluated in the context of the global COVID-19 pandemic, which necessitated consideration that the patient might be at risk for infection with the SARS-CoV-2 virus that causes COVID-19. Institutional protocols and algorithms that pertain to the evaluation of patients at risk for COVID-19 are in a state of rapid change based on information released by regulatory bodies including the CDC and federal and state organizations. These policies and algorithms were followed during the patient's care in the ED.     Patient presents with viral symptoms for 3 days. Adequate oral hydration. Rest of history as above.  Patient appears well. No signs of toxicity, patient is interactive. No hypoxia, tachypnea or other signs of respiratory distress. No sign of clinical dehydration. Lung exam clear. LUQ and left flank pain Rest of exam as above.  CBC without leukocytosis or anemia.  No significant electrolyte derangements or renal sufficiency.  No evidence of bili obstruction or pancreatitis. UA w/o infection.  COVID/influenza negative.  Most consistent with viral illness   No evidence suggestive of pharyngitis, AOM, PNA, or meningitis.  Chest x-ray not indicated at this time.  Discussed symptomatic treatment with the patient and they will follow closely with their PCP.   Pertinent labs & imaging results that were available during my care of the patient were reviewed by me and considered in my medical decision making:    Final Clinical Impression(s) / ED Diagnoses Final diagnoses:  Viral syndrome   The patient appears reasonably screened and/or stabilized for discharge and I doubt any  other medical condition or other [facility] requiring further screening, evaluation, or treatment in the ED at this time prior to discharge. Safe for discharge with strict return precautions.  Disposition: Discharge  Condition: Good  I have discussed the results, Dx and Tx plan with the patient/family who expressed understanding and agree(s) with the plan. Discharge instructions discussed at length. The patient/family was given strict return precautions  who verbalized understanding of the instructions. No further questions at time of discharge.    ED Discharge Orders     None       Follow Up: [facility], [address] [place] [phone]  Call  in 3-5 days, if symptoms do not improve or  worsen    This chart was dictated using voice recognition software.  Despite best efforts to proofread,  errors can occur which can change the documentation meaning.    Fatima Blank, MD 08/16/21 [phone]

## 2022-02-21 ENCOUNTER — Encounter (HOSPITAL_COMMUNITY): Payer: Self-pay

## 2022-02-21 ENCOUNTER — Ambulatory Visit (INDEPENDENT_AMBULATORY_CARE_PROVIDER_SITE_OTHER): Payer: Managed Care, Other (non HMO)

## 2022-02-21 ENCOUNTER — Ambulatory Visit (HOSPITAL_COMMUNITY)
Admission: EM | Admit: 2022-02-21 | Discharge: 2022-02-21 | Disposition: A | Discharge status: Home / Self Care | Payer: Managed Care, Other (non HMO) | Attending: Sports Medicine | Admitting: Sports Medicine

## 2022-02-21 ENCOUNTER — Ambulatory Visit (HOSPITAL_COMMUNITY)
Admission: EM | Admit: 2022-02-21 | Discharge: 2022-02-21 | Discharge status: Absent without leave | Payer: Managed Care, Other (non HMO)

## 2022-02-21 ENCOUNTER — Other Ambulatory Visit: Payer: Self-pay

## 2022-02-21 DIAGNOSIS — S134XXA Sprain of ligaments of cervical spine, initial encounter: Secondary | ICD-10-CM

## 2022-02-21 DIAGNOSIS — M542 Cervicalgia: Secondary | ICD-10-CM

## 2022-02-21 DIAGNOSIS — S46819A Strain of other muscles, fascia and tendons at shoulder and upper arm level, unspecified arm, initial encounter: Secondary | ICD-10-CM

## 2022-02-21 NOTE — Discharge Instructions (Addendum)
-   Ibuprofen or Aleve twice daily with food for the next 3-4 days, then as needed ?-Apply heat to the neck ?-Recommend doing neck rehab exercises after the next few days when she acute pain subsides ?

## 2022-02-21 NOTE — ED Triage Notes (Signed)
Pt took a fall over the weekend while skating. She reports right wrist pain, neck and shoulder pain.  ?

## 2022-02-21 NOTE — ED Provider Notes (Signed)
?[facility] ? ? ? ?CSN: [id number] ?Arrival date & time: 02/21/22  1019 ? ? ?  ? ?History   ?Chief Complaint ?No chief complaint on file. ? ? ?HPI ?Tracy Garza is a 31 y.o. female here for neck and upper back pain. ? ?HPI ? ?Patient was skating at [employer] [place] on Saturday.  She states she had a sudden fall where her ski clipped the floor and she fell backward and then lunged forward falling onto her hands.  Reports her neck whipped forward when she landed.  She does have some bruising on her palms, although this pain is getting better but she is still having pain of the anterior and posterior neck as well as bilateral upper trap and back pain.  Denies any previous history of neck or back injury.  She did ice the neck and took ibuprofen once on Saturday which did help.  She denies any radiation of pain or numbness and tingling into either upper extremity.  She did have a mild headache after her fall, although this has resolved.  Denies any lightheadedness or dizziness.  The neck feels stiff and she feels like she has to turn her whole upper body when looking to the left and right.  Any bruising or redness of the neck.  She did not hit her head nor lose consciousness during the injury. ? ?Meds: Ibuprofen once  ? ?Past Medical History:  ?Diagnosis Date  ? Cutaneous lupus erythematosus 2017  ? ? ?Patient Active Problem List  ? Diagnosis Date Noted  ? Pyelonephritis 12/12/2015  ? ? ?Past Surgical History:  ?Procedure Laterality Date  ? INDUCED ABORTION    ? ? ?OB History   ? ? Gravida  ?7  ? Para  ?2  ? Term  ?2  ? Preterm  ?   ? AB  ?4  ? Living  ?2  ?  ? ? SAB  ?   ? IAB  ?4  ? Ectopic  ?   ? Multiple  ?   ? Live Births  ?2  ?   ?  ?  ? ? ? ?Home Medications   ? ?Prior to Admission medications   ?Medication Sig Start Date End Date Taking? Authorizing Provider  ?fluconazole (DIFLUCAN) 150 MG tablet Take 150 mg by mouth once. 09/01/20   [provider]  ?Ardyth Harps 0.12-0.015 MG/24HR vaginal ring 1  each. 08/18/20   [provider]  ?potassium chloride (KLOR-CON) 10 MEQ tablet Take 1 tablet (10 mEq total) by mouth daily for 5 days. 01/23/20 09/02/20  Nugent, Gerrie Nordmann, NP  ? ? ?Family History ?Family History  ?Problem Relation Age of Onset  ? Fibromyalgia Mother   ? Multiple sclerosis Sister   ? Multiple sclerosis Maternal Uncle   ? Cancer Maternal Grandmother   ? Lupus Paternal Grandmother   ? ? ?Social History ?Social History  ? ?Tobacco Use  ? Smoking status: Former  ?  Packs/day: 0.25  ?  Types: Cigarettes  ? Smokeless tobacco: Never  ?Vaping Use  ? Vaping Use: Every day  ?Substance Use Topics  ? Alcohol use: Yes  ?  Alcohol/week: 1.0 standard drink  ?  Types: 1 Glasses of wine per week  ?  Comment: occasionally  ? Drug use: No  ? ? ? ?Allergies   ?Morphine and related and Strawberry (diagnostic) ? ? ?Review of Systems ?Review of Systems  ?Constitutional:  Negative for chills and fever.  ?Musculoskeletal:  Positive for neck pain  and neck stiffness.  ?Neurological:  Negative for dizziness, light-headedness and headaches.  ? ? ?Physical Exam ?Triage Vital Signs ?ED Triage Vitals [02/21/22 1116]  ?Enc Vitals Group  ?   BP 119/80  ?   Pulse Rate 88  ?   Resp 16  ?   Temp 98 ?F (36.7 ?C)  ?   Temp Source Oral  ?   SpO2   ?   Weight   ?   Height   ?   Head Circumference   ?   Peak Flow   ?   Pain Score   ?   Pain Loc   ?   Pain Edu?   ?   Excl. in [place]?   ? ?No data found. ? ?Updated Vital Signs ?BP 119/80 (BP Location: Left Arm)   Pulse 88   Temp 98 ?F (36.7 ?C) (Oral)   Resp 16   LMP 02/19/2022  ? ?Physical Exam ?Gen: Well-appearing, in no acute distress; non-toxic ?CV: Regular Rate. Well-perfused. Warm.  ?Resp: Breathing unlabored on room air; no wheezing. ?Psych: Fluid speech in conversation; appropriate affect; normal thought process ?Neuro: Sensation intact throughout. No gross coordination deficits.  ?MSK:  ?- Cervical spine: Inspection yields no gross deformity, asymmetry or ecchymosis.  There is  positive TTP over the spinous process of C7.  There is associated cervical paraspinal hypertonicity and bilateral trapezius hypertonicity with tenderness to palpation.  There is tenderness on the anterior cervical paraspinal muscles.  There is limited active range of motion of the neck with extension and rotation both left and right, full range of motion with flexion.  5/5 strength of upper extremity in the C5-T1 nerve root distribution.  Gross sensation intact bilaterally. ? ? ?UC Treatments / Results  ?Labs ?(all labs ordered are listed, but only abnormal results are displayed) ?Labs Reviewed - No data to display ? ?EKG ? ? ?Radiology ?DG Cervical Spine Complete ? ?Result Date: 02/21/2022 ?CLINICAL DATA:  Fall this past week in with persistent neck pain. EXAM: CERVICAL SPINE - COMPLETE 4+ VIEW COMPARISON:  None. FINDINGS: Vertebral body alignment and heights are normal. Prevertebral soft tissues are normal. The atlantoaxial articulation is unremarkable. Moderate facet arthropathy over the C7-T1 level. No significant neural foraminal narrowing. No acute fracture or subluxation. IMPRESSION: 1. No acute findings. 2. Moderate facet arthropathy over the C7-T1 level. Electronically Signed   By: Marin Olp M.D.   On: 02/21/2022 12:20   ? ?Procedures ?Procedures (including critical care time) ? ?Medications Ordered in UC ?Medications - No data to display ? ?Initial Impression / Assessment and Plan / UC Course  ?I have reviewed the triage vital signs and the nursing notes. ? ?Pertinent labs & imaging results that were available during my care of the patient were reviewed by me and considered in my medical decision making (see chart for details). ? ?  ? ?Neck pain ?Whiplash injury  ?3.  Trapezius hypertonicity bilaterally ? ?Patient presents with whiplash injury upon falling skating on Saturday.  Has notable hypertonicity of the neck and some restricted range of motion.  She is tender along the anterior aspect of the neck  with likely ALL sprain/strain.  X-rays of the neck demonstrate loss of normal cervical lordosis, although no acute fracture or bony abnormality noted.  Discussed all treatment options with the patient and offered muscle relaxer and/or anti-inflammatories.  Patient is okay to take over-the-counter anti-inflammatories, heat and some adequate rest.  Believe this will get better with time.  Did provide home exercises for the cervical sprain to prevent stiffness.  She will follow-up with her primary care physician if needed.  ? ?Final Clinical Impressions(s) / UC Diagnoses  ? ?Final diagnoses:  ?Neck pain  ?Strain of trapezius muscle, unspecified laterality, initial encounter  ?Whiplash injury to neck, initial encounter  ? ?Discharge Instructions   ?None ?  ? ?ED Prescriptions   ?None ?  ? ?PDMP not reviewed this encounter. ?  Elba Barman, DO ?02/21/22 1245 ? ?

## 2022-04-03 IMAGING — DX DG CERVICAL SPINE COMPLETE 4+V
5 series · 5 of 5 positions shown · non-contrast
Comparison: None.

CLINICAL DATA: Fall this past week in with persistent neck pain.

EXAM:
CERVICAL SPINE - COMPLETE 4+ VIEW

[c-spine obl (1 of 2)]
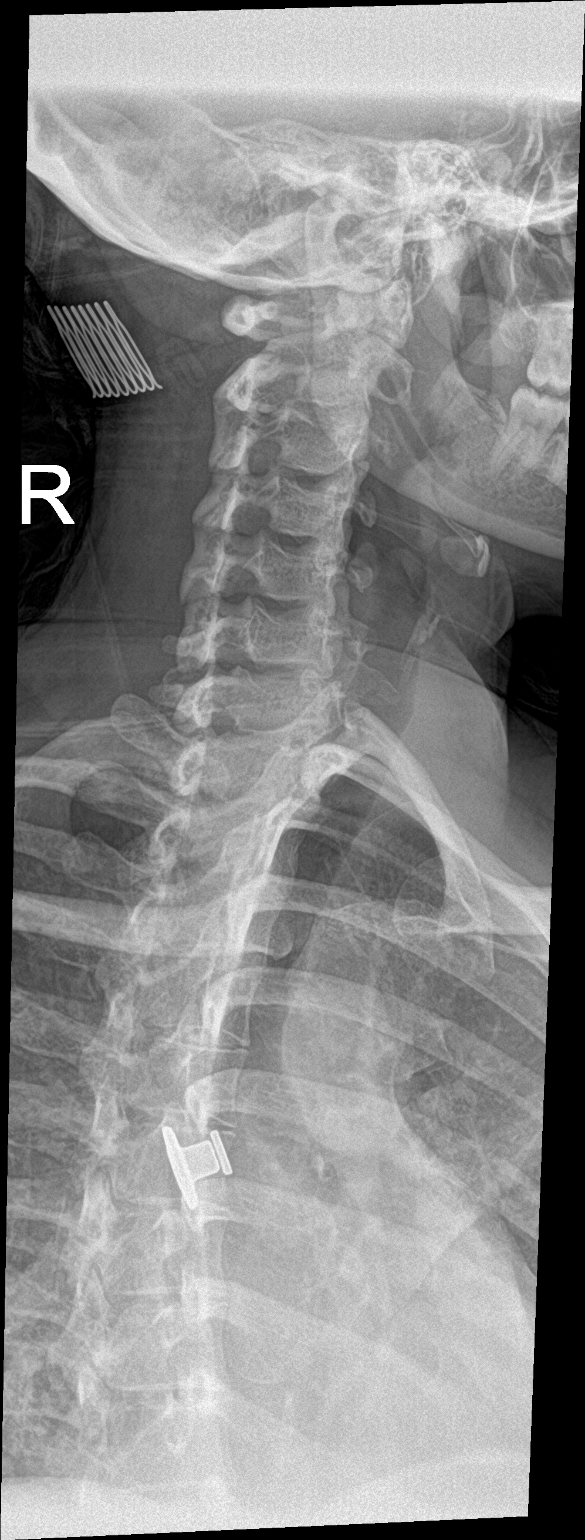

[c-spine obl (2 of 2)]
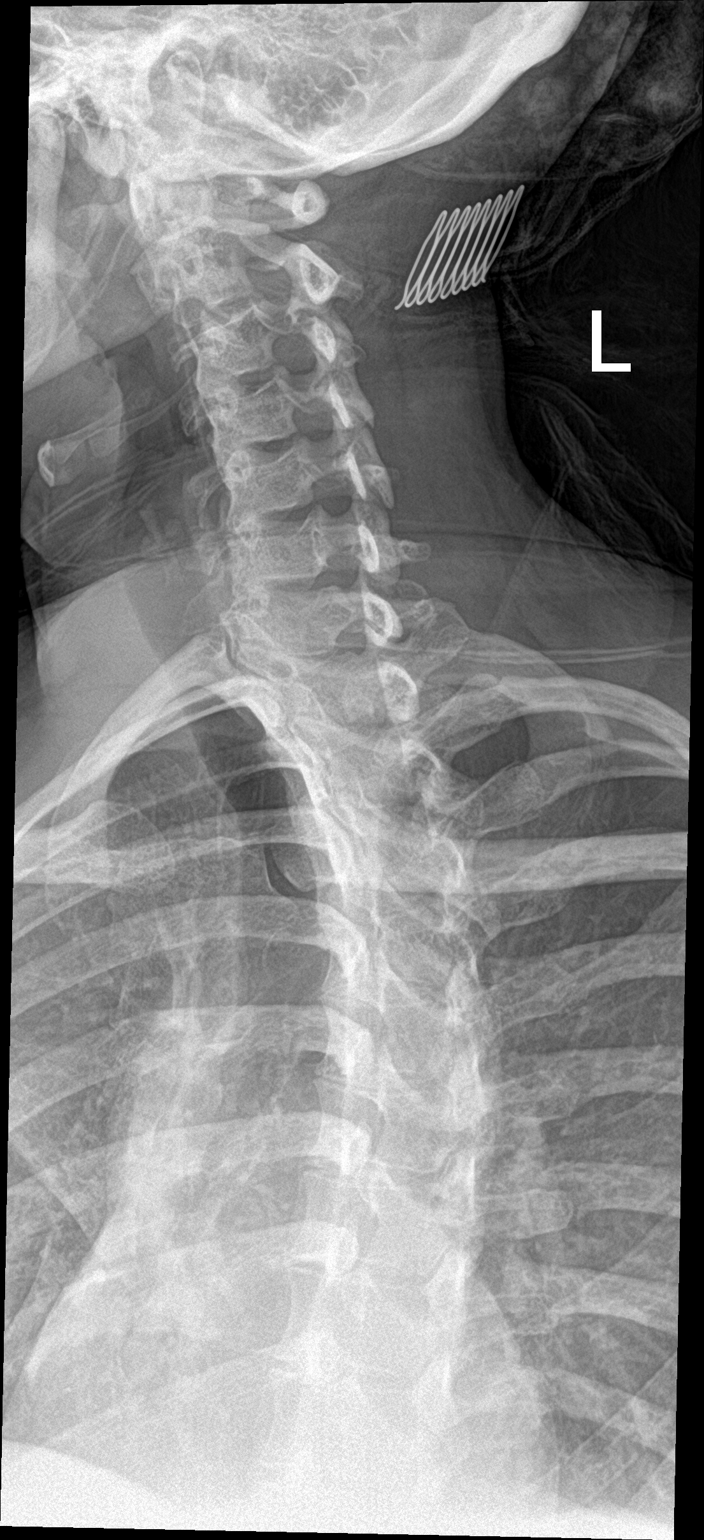

[c-spine ap]
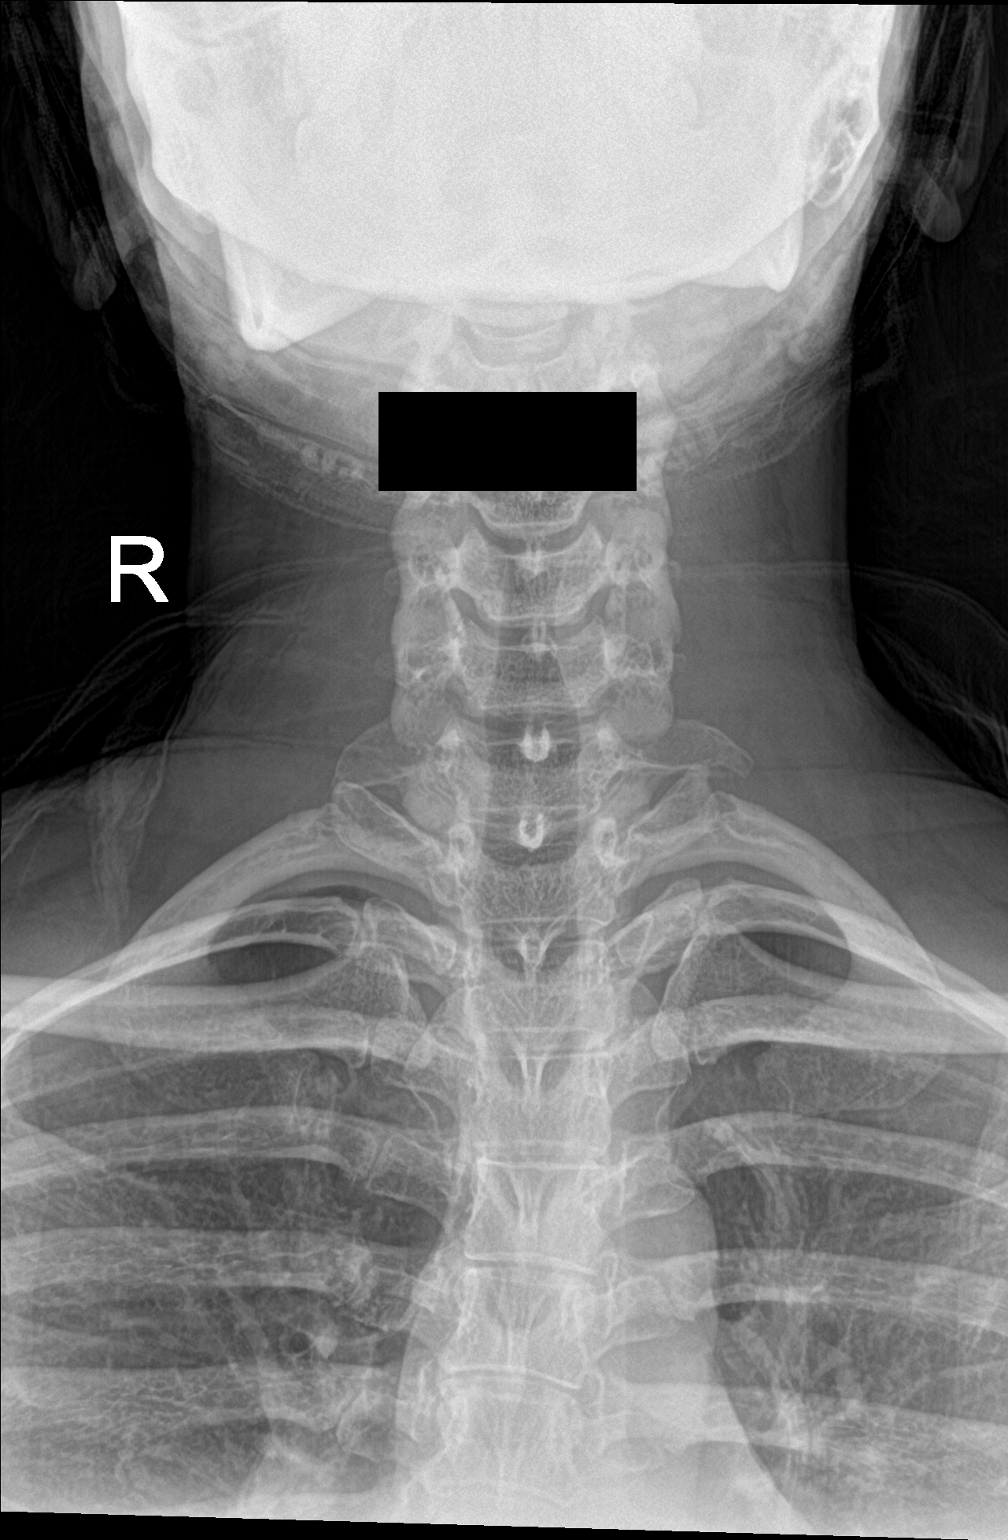

[c-spine open mouth (1 of 2)]
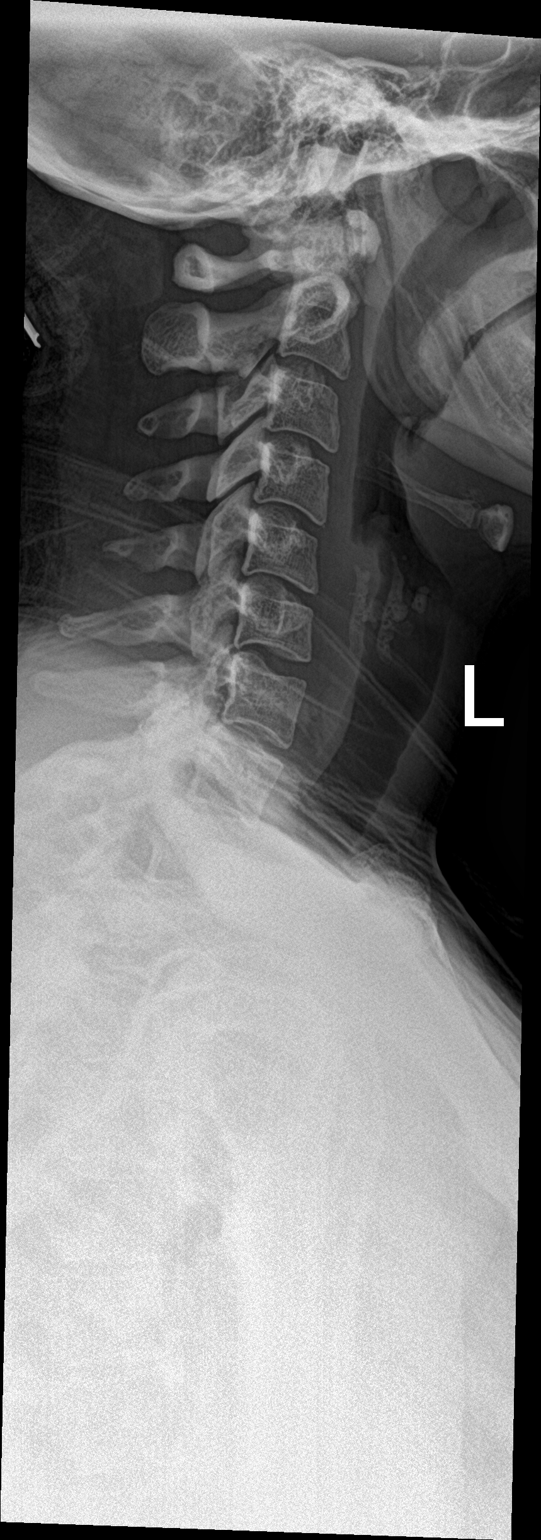

[c-spine open mouth (2 of 2)]
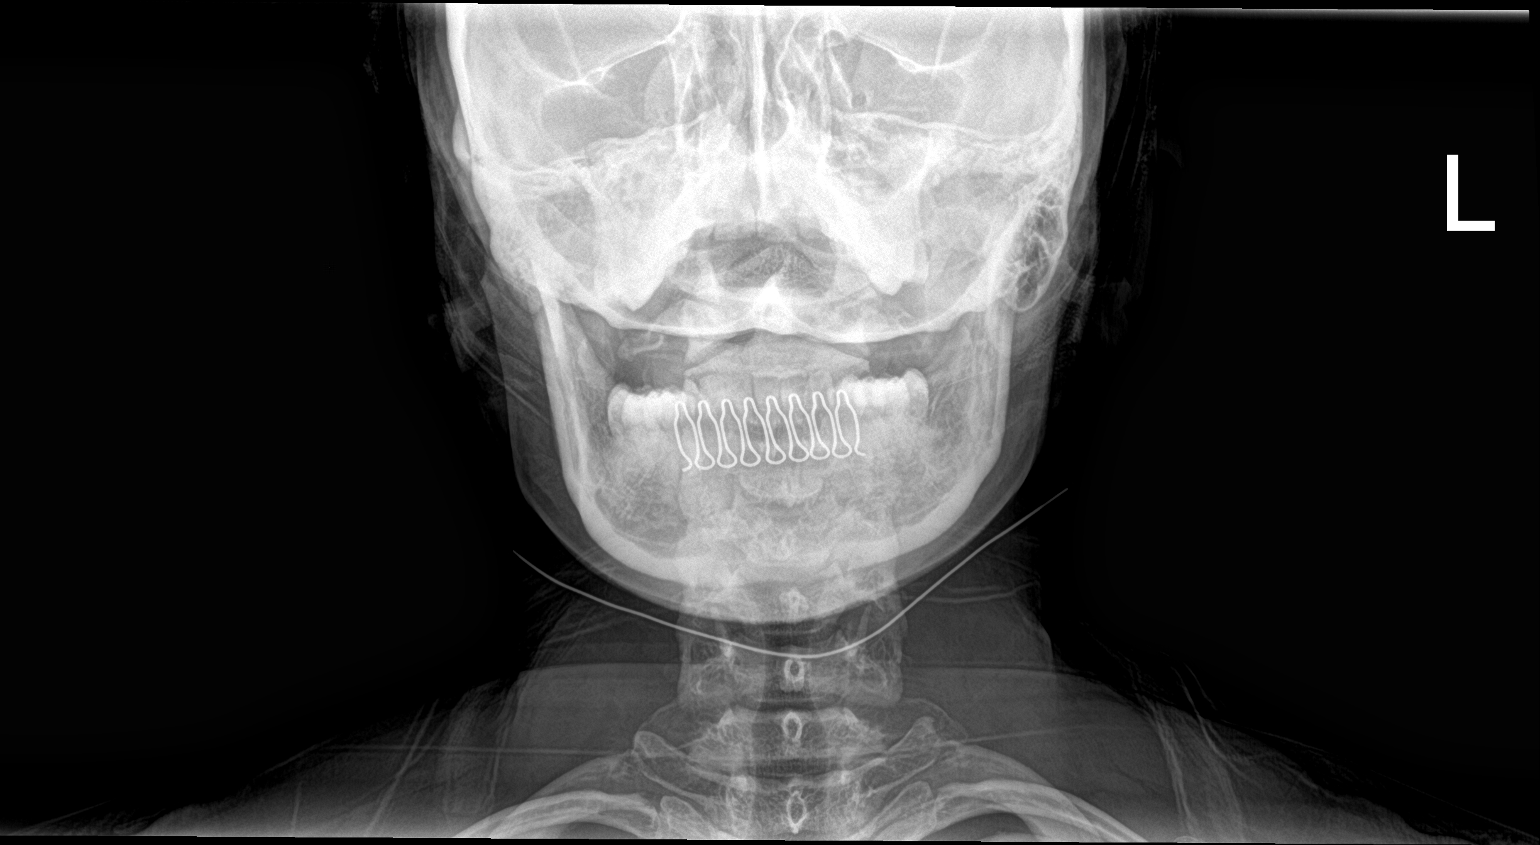

[5 of 5 positions shown; findings below may reference images not displayed]

FINDINGS: Vertebral body alignment and heights are normal. Prevertebral soft
tissues are normal. The atlantoaxial articulation is unremarkable.
Moderate facet arthropathy over the C7-T1 level. No significant
neural foraminal narrowing. No acute fracture or subluxation.
IMPRESSION: 1. No acute findings.
2. Moderate facet arthropathy over the C7-T1 level.

## 2023-10-06 ENCOUNTER — Encounter (HOSPITAL_COMMUNITY): Payer: Self-pay | Admitting: Emergency Medicine

## 2023-10-06 ENCOUNTER — Emergency Department (HOSPITAL_COMMUNITY)
Admission: EM | Admit: 2023-10-06 | Discharge: 2023-10-06 | Discharge status: Against Medical Advice | Payer: Managed Care, Other (non HMO) | Attending: Emergency Medicine | Admitting: Emergency Medicine

## 2023-10-06 DIAGNOSIS — Z5321 Procedure and treatment not carried out due to patient leaving prior to being seen by health care provider: Secondary | ICD-10-CM | POA: Insufficient documentation

## 2023-10-06 DIAGNOSIS — T3371XA Superficial frostbite of right knee and lower leg, initial encounter: Secondary | ICD-10-CM | POA: Insufficient documentation

## 2023-10-06 DIAGNOSIS — X31XXXA Exposure to excessive natural cold, initial encounter: Secondary | ICD-10-CM | POA: Insufficient documentation

## 2023-10-06 NOTE — ED Triage Notes (Addendum)
Pt reports she had ice pack on her leg for 9 minutes and believes she has frost bite. She was using Biofreeze and ice for last 3 day without issue. She has some red raised areas to side of R leg but ice pack was in front of leg and on back of leg. She says she saw online she could lose her leg so came to ED>

## 2023-10-06 NOTE — ED Notes (Signed)
Turned in stickers said they was leaving. Patient seen leaving ED

## 2023-10-09 NOTE — Plan of Care (Signed)
[facility] Tonsillectomy/Adenoidectomy, Postoperative PEDS care plan entered in error.
